# Patient Record
Sex: Male | Born: 1965 | Race: White | Hispanic: No | Marital: Married | Smoking: Never smoker
Health system: Southern US, Community
[De-identification: ages and names within clinical notes are randomized; demographics above are authoritative.]

## PROBLEM LIST (undated history)

## (undated) DIAGNOSIS — E119 Type 2 diabetes mellitus without complications: Secondary | ICD-10-CM

## (undated) DIAGNOSIS — M1990 Unspecified osteoarthritis, unspecified site: Secondary | ICD-10-CM

## (undated) DIAGNOSIS — I10 Essential (primary) hypertension: Secondary | ICD-10-CM

## (undated) DIAGNOSIS — G4730 Sleep apnea, unspecified: Secondary | ICD-10-CM

---

## 1991-04-17 HISTORY — PX: FRACTURE SURGERY: SHX138

## 1997-04-16 HISTORY — PX: KNEE ARTHROSCOPY: SUR90

## 2009-04-16 HISTORY — PX: BACK SURGERY: SHX140

## 2014-02-26 ENCOUNTER — Other Ambulatory Visit: Payer: Self-pay | Admitting: Physician Assistant

## 2014-02-26 NOTE — H&P (Signed)
TOTAL KNEE ADMISSION H&P  Patient is being admitted for right total knee arthroplasty.  Subjective:  Chief Complaint:right knee pain.  HPI: Lee [name], 48 y.o. male, has a history of pain and functional disability in the right knee due to arthritis and has failed non-surgical conservative treatments for greater than 12 weeks to includeNSAID's and/or analgesics, corticosteriod injections, viscosupplementation injections and activity modification.  Onset of symptoms was gradual, starting >10 years ago with rapidlly worsening course since that time. The patient noted prior procedures on the knee to include  arthroscopy and menisectomy on the right knee(s).  Patient currently rates pain in the right knee(s) at 8 out of 10 with activity. Patient has night pain, worsening of pain with activity and weight bearing and crepitus.  Patient has evidence of subchondral cysts, subchondral sclerosis, periarticular osteophytes and joint space narrowing by imaging studies. There is no active infection.  There are no active problems to display for this patient.  No past medical history on file.  No past surgical history on file.   (Not in a hospital admission) Allergies not on file  History  Substance Use Topics  . Smoking status: Not on file  . Smokeless tobacco: Not on file  . Alcohol Use: Not on file    No family history on file.   Review of Systems  Constitutional: Negative.   HENT: Negative.   Eyes: Negative.   Respiratory: Negative.   Cardiovascular: Negative.   Gastrointestinal: Negative.   Genitourinary: Negative.   Musculoskeletal: Positive for back pain and joint pain.  Skin: Negative.   Neurological: Negative.   Endo/Heme/Allergies: Negative.   Psychiatric/Behavioral: Negative.     Objective:  Physical Exam  Constitutional: He is oriented to person, place, and time. He appears well-developed and well-nourished.  HENT:  Head: Normocephalic and atraumatic.  Eyes: EOM are normal.  Pupils are equal, round, and reactive to light.  Neck: Normal range of motion. Neck supple.  Cardiovascular: Normal rate, regular rhythm and normal heart sounds.  Exam reveals no gallop and no friction rub.   No murmur heard. Respiratory: Effort normal and breath sounds normal. No respiratory distress. He has no wheezes. He has no rales.  GI: Soft. Bowel sounds are normal.  Musculoskeletal:  Exam of his right knee reveals varus thrust, range of motion 0-95 degrees. Mild to moderate patellofemoral crepitus. Tenderness to palpation medial joint line. Positive straight leg raise negative log roll. Stable to varus and valgus stress. He is neurovascularly intact distally.  Neurological: He is alert and oriented to person, place, and time.  Skin: Skin is warm and dry.  Psychiatric: He has a normal mood and affect. His behavior is normal. Judgment and thought content normal.    Vital signs in last 24 hours: @VSRANGES @  Labs:   There is no height or weight on file to calculate BMI.   Imaging Review Plain radiographs demonstrate severe degenerative joint disease of the right knee(s). The overall alignment ismild varus. The bone quality appears to be fair for age and reported activity level.  Assessment/Plan:  End stage arthritis, right knee   The patient history, physical examination, clinical judgment of the provider and imaging studies are consistent with end stage degenerative joint disease of the right knee(s) and total knee arthroplasty is deemed medically necessary. The treatment options including medical management, injection therapy arthroscopy and arthroplasty were discussed at length. The risks and benefits of total knee arthroplasty were presented and reviewed. The risks due to aseptic loosening, infection, stiffness,  patella tracking problems, thromboembolic complications and other imponderables were discussed. The patient acknowledged the explanation, agreed to proceed with the plan  and consent was signed. Patient is being admitted for inpatient treatment for surgery, pain control, PT, OT, prophylactic antibiotics, VTE prophylaxis, progressive ambulation and ADL's and discharge planning. The patient is planning to be discharged home with home health services   

## 2014-03-08 NOTE — Pre-Procedure Instructions (Addendum)
Lee [name]  03/08/2014   Your procedure is scheduled on:  Wednesday, December 2.  [facility] at 9:30 AM.  Call this number if you have problems the morning of surgery: [phone]   Remember:   Do not eat food or drink liquids after midnight Tuesday, December 1.   Take these medicines the morning of surgery with A SIP OF WATER: Amlodipine-Benazepril   Do not wear jewelry.  Do not wear lotions, powders, or colognes.    Men may shave face and neck.  Do not bring valuables to the hospital.              [facility] is not responsible  for any belongings or valuables.               Contacts, dentures or bridgework may not be worn into surgery.  Leave suitcase in the car. After surgery it may be brought to your room.  For patients admitted to the hospital, discharge time is determined by your treatment team.                 Special Instructions: [facility] - Preparing for Surgery  Before surgery, you can play an important role.  Because skin is not sterile, your skin needs to be as free of germs as possible.  You can reduce the number of germs on you skin by washing with CHG (chlorahexidine gluconate) soap before surgery.  CHG is an antiseptic cleaner which kills germs and bonds with the skin to continue killing germs even after washing.  Please DO NOT use if you have an allergy to CHG or antibacterial soaps.  If your skin becomes reddened/irritated stop using the CHG and inform your nurse when you arrive at Short Stay.  Do not shave (including legs and underarms) for at least 48 hours prior to the first CHG shower.  You may shave your face.  Please follow these instructions carefully:   1.  Shower with CHG Soap the night before surgery and the                                morning of Surgery.  2.  If you choose to wash your hair, wash your hair first as usual with your       normal shampoo.  3.  After you shampoo, rinse your hair and body thoroughly  to remove the                      Shampoo.  4.  Use CHG as you would any other liquid soap.  You can apply chg directly       to the skin and wash gently with scrungie or a clean washcloth.  5.  Apply the CHG Soap to your body ONLY FROM THE NECK DOWN.        Do not use on open wounds or open sores.  Avoid contact with your eyes,       ears, mouth and genitals (private parts).  Wash genitals (private parts)       with your normal soap.  6.  Wash thoroughly, paying special attention to the area where your surgery        will be performed.  7.  Thoroughly rinse your body with warm water from the neck down.  8.  DO NOT shower/wash with your normal soap after using and  rinsing off       the CHG Soap.  9.  Pat yourself dry with a clean towel.            10.  Wear clean pajamas.            11.  Place clean sheets on your bed the night of your first shower and do not        sleep with pets.  Day of Surgery  Do not apply any lotions/deoderants the morning of surgery.  Please wear clean clothes to the hospital/surgery center.      Please read over the following fact sheets that you were given: Pain Booklet, Coughing and Deep Breathing, Blood Transfusion Information and Surgical Site Infection Prevention

## 2014-03-09 ENCOUNTER — Encounter (HOSPITAL_COMMUNITY): Payer: Self-pay

## 2014-03-09 ENCOUNTER — Encounter (HOSPITAL_COMMUNITY)
Admission: RE | Admit: 2014-03-09 | Discharge: 2014-03-09 | Disposition: A | Discharge status: Home / Self Care | Payer: BC Managed Care – PPO | Source: Ambulatory Visit | Attending: Orthopedic Surgery | Admitting: Orthopedic Surgery

## 2014-03-09 DIAGNOSIS — M1711 Unilateral primary osteoarthritis, right knee: Secondary | ICD-10-CM | POA: Insufficient documentation

## 2014-03-09 DIAGNOSIS — Z01818 Encounter for other preprocedural examination: Secondary | ICD-10-CM | POA: Insufficient documentation

## 2014-03-09 HISTORY — DX: Essential (primary) hypertension: I10

## 2014-03-09 HISTORY — DX: Unspecified osteoarthritis, unspecified site: M19.90

## 2014-03-09 HISTORY — DX: Sleep apnea, unspecified: G47.30

## 2014-03-09 LAB — COMPREHENSIVE METABOLIC PANEL
ALK PHOS: 96 U/L (ref 39–117)
ALT: 37 U/L (ref 0–53)
AST: 20 U/L (ref 0–37)
Albumin: 4.1 g/dL (ref 3.5–5.2)
Anion gap: 15 (ref 5–15)
BUN: 9 mg/dL (ref 6–23)
CO2: 24 meq/L (ref 19–32)
Calcium: 9.2 mg/dL (ref 8.4–10.5)
Chloride: 103 mEq/L (ref 96–112)
Creatinine, Ser: 0.83 mg/dL (ref 0.50–1.35)
Glucose, Bld: 167 mg/dL — ABNORMAL HIGH (ref 70–99)
Potassium: 4.5 mEq/L (ref 3.7–5.3)
SODIUM: 142 meq/L (ref 137–147)
Total Bilirubin: 0.3 mg/dL (ref 0.3–1.2)
Total Protein: 7.6 g/dL (ref 6.0–8.3)

## 2014-03-09 LAB — PROTIME-INR
INR: 0.96 (ref 0.00–1.49)
Prothrombin Time: 12.8 seconds (ref 11.6–15.2)

## 2014-03-09 LAB — CBC WITH DIFFERENTIAL/PLATELET
BASOS ABS: 0 10*3/uL (ref 0.0–0.1)
Basophils Relative: 0 % (ref 0–1)
Eosinophils Absolute: 0.1 10*3/uL (ref 0.0–0.7)
Eosinophils Relative: 1 % (ref 0–5)
HCT: 49.5 % (ref 39.0–52.0)
Hemoglobin: 17.3 g/dL — ABNORMAL HIGH (ref 13.0–17.0)
LYMPHS ABS: 1.8 10*3/uL (ref 0.7–4.0)
Lymphocytes Relative: 24 % (ref 12–46)
MCH: 31.7 pg (ref 26.0–34.0)
MCHC: 34.9 g/dL (ref 30.0–36.0)
MCV: 90.7 fL (ref 78.0–100.0)
Monocytes Absolute: 0.6 10*3/uL (ref 0.1–1.0)
Monocytes Relative: 7 % (ref 3–12)
NEUTROS ABS: 5.2 10*3/uL (ref 1.7–7.7)
NEUTROS PCT: 68 % (ref 43–77)
PLATELETS: 210 10*3/uL (ref 150–400)
RBC: 5.46 MIL/uL (ref 4.22–5.81)
RDW: 12.9 % (ref 11.5–15.5)
WBC: 7.7 10*3/uL (ref 4.0–10.5)

## 2014-03-09 LAB — URINALYSIS, ROUTINE W REFLEX MICROSCOPIC
Bilirubin Urine: NEGATIVE
GLUCOSE, UA: NEGATIVE mg/dL
Hgb urine dipstick: NEGATIVE
Ketones, ur: 15 mg/dL — AB
LEUKOCYTES UA: NEGATIVE
Nitrite: NEGATIVE
Protein, ur: NEGATIVE mg/dL
Specific Gravity, Urine: 1.028 (ref 1.005–1.030)
Urobilinogen, UA: 0.2 mg/dL (ref 0.0–1.0)
pH: 5.5 (ref 5.0–8.0)

## 2014-03-09 LAB — TYPE AND SCREEN
ABO/RH(D): B NEG
Antibody Screen: NEGATIVE

## 2014-03-09 LAB — ABO/RH: ABO/RH(D): B NEG

## 2014-03-09 LAB — APTT: APTT: 24 s (ref 24–37)

## 2014-03-09 LAB — SURGICAL PCR SCREEN
MRSA, PCR: NEGATIVE
Staphylococcus aureus: NEGATIVE

## 2014-03-09 MED ORDER — CHLORHEXIDINE GLUCONATE 4 % EX LIQD: 60.0000 mL | Freq: Once | CUTANEOUS | Status: DC

## 2014-03-10 LAB — URINE CULTURE
Colony Count: NO GROWTH
Culture: NO GROWTH

## 2014-03-16 MED ORDER — CEFAZOLIN SODIUM-DEXTROSE 2-3 GM-% IV SOLR
2.0000 g | INTRAVENOUS | Status: AC
  Administered 2014-03-17: 2 g via INTRAVENOUS
  Filled 2014-03-16: qty 50

## 2014-03-17 ENCOUNTER — Inpatient Hospital Stay (HOSPITAL_COMMUNITY): Payer: BC Managed Care – PPO | Admitting: Anesthesiology

## 2014-03-17 ENCOUNTER — Inpatient Hospital Stay (HOSPITAL_COMMUNITY)
Admission: RE | Admit: 2014-03-17 | Discharge: 2014-03-19 | DRG: 470 | Disposition: A | Discharge status: Home Health | Payer: BC Managed Care – PPO | Source: Ambulatory Visit | Attending: Orthopedic Surgery | Admitting: Orthopedic Surgery

## 2014-03-17 ENCOUNTER — Encounter (HOSPITAL_COMMUNITY)
Admission: RE | Disposition: A | Discharge status: Home Health | Payer: Self-pay | Source: Ambulatory Visit | Attending: Orthopedic Surgery

## 2014-03-17 ENCOUNTER — Inpatient Hospital Stay (HOSPITAL_COMMUNITY): Payer: BC Managed Care – PPO

## 2014-03-17 ENCOUNTER — Encounter (HOSPITAL_COMMUNITY): Payer: Self-pay | Admitting: *Deleted

## 2014-03-17 DIAGNOSIS — Z79899 Other long term (current) drug therapy: Secondary | ICD-10-CM | POA: Diagnosis not present

## 2014-03-17 DIAGNOSIS — Z7901 Long term (current) use of anticoagulants: Secondary | ICD-10-CM

## 2014-03-17 DIAGNOSIS — M25561 Pain in right knee: Secondary | ICD-10-CM | POA: Diagnosis present

## 2014-03-17 DIAGNOSIS — M1711 Unilateral primary osteoarthritis, right knee: Principal | ICD-10-CM | POA: Diagnosis present

## 2014-03-17 DIAGNOSIS — M179 Osteoarthritis of knee, unspecified: Secondary | ICD-10-CM | POA: Diagnosis present

## 2014-03-17 DIAGNOSIS — G4730 Sleep apnea, unspecified: Secondary | ICD-10-CM | POA: Diagnosis present

## 2014-03-17 DIAGNOSIS — D62 Acute posthemorrhagic anemia: Secondary | ICD-10-CM | POA: Diagnosis not present

## 2014-03-17 DIAGNOSIS — Z96659 Presence of unspecified artificial knee joint: Secondary | ICD-10-CM

## 2014-03-17 DIAGNOSIS — I10 Essential (primary) hypertension: Secondary | ICD-10-CM | POA: Diagnosis present

## 2014-03-17 DIAGNOSIS — M1710 Unilateral primary osteoarthritis, unspecified knee: Secondary | ICD-10-CM | POA: Diagnosis present

## 2014-03-17 HISTORY — PX: TOTAL KNEE ARTHROPLASTY: SHX125

## 2014-03-17 LAB — GLUCOSE, CAPILLARY
Glucose-Capillary: 156 mg/dL — ABNORMAL HIGH (ref 70–99)
Glucose-Capillary: 166 mg/dL — ABNORMAL HIGH (ref 70–99)

## 2014-03-17 SURGERY — ARTHROPLASTY, KNEE, TOTAL
Anesthesia: General | Site: Knee | Laterality: Right

## 2014-03-17 MED ORDER — METHOCARBAMOL 1000 MG/10ML IJ SOLN
500.0000 mg | INTRAVENOUS | Status: AC
  Administered 2014-03-17: 500 mg via INTRAVENOUS
  Filled 2014-03-17: qty 5

## 2014-03-17 MED ORDER — MIDAZOLAM HCL 5 MG/5ML IJ SOLN
INTRAMUSCULAR | Status: DC | PRN
  Administered 2014-03-17: 2 mg via INTRAVENOUS

## 2014-03-17 MED ORDER — METOCLOPRAMIDE HCL 5 MG/ML IJ SOLN: 5.0000 mg | Freq: Three times a day (TID) | INTRAMUSCULAR | Status: DC | PRN

## 2014-03-17 MED ORDER — APIXABAN 2.5 MG PO TABS: ORAL_TABLET | ORAL | Status: DC

## 2014-03-17 MED ORDER — HYDROMORPHONE HCL 1 MG/ML IJ SOLN
0.5000 mg | INTRAMUSCULAR | Status: DC | PRN
  Administered 2014-03-17 – 2014-03-18 (×6): 1 mg via INTRAVENOUS
  Filled 2014-03-17 (×6): qty 1

## 2014-03-17 MED ORDER — BISACODYL 5 MG PO TBEC: 5.0000 mg | DELAYED_RELEASE_TABLET | Freq: Every day | ORAL | Status: AC | PRN

## 2014-03-17 MED ORDER — MENTHOL 3 MG MT LOZG: 1.0000 | LOZENGE | OROMUCOSAL | Status: DC | PRN

## 2014-03-17 MED ORDER — METHOCARBAMOL 1000 MG/10ML IJ SOLN
500.0000 mg | Freq: Four times a day (QID) | INTRAVENOUS | Status: DC | PRN
  Filled 2014-03-17: qty 5

## 2014-03-17 MED ORDER — METOCLOPRAMIDE HCL 10 MG PO TABS: 5.0000 mg | ORAL_TABLET | Freq: Three times a day (TID) | ORAL | Status: DC | PRN

## 2014-03-17 MED ORDER — FENTANYL CITRATE 0.05 MG/ML IJ SOLN
INTRAMUSCULAR | Status: AC
  Filled 2014-03-17: qty 5

## 2014-03-17 MED ORDER — ONDANSETRON HCL 4 MG/2ML IJ SOLN
4.0000 mg | Freq: Once | INTRAMUSCULAR | Status: AC | PRN
  Administered 2014-03-17: 4 mg via INTRAVENOUS

## 2014-03-17 MED ORDER — NEOSTIGMINE METHYLSULFATE 10 MG/10ML IV SOLN
INTRAVENOUS | Status: DC | PRN
  Administered 2014-03-17: 4 mg via INTRAVENOUS

## 2014-03-17 MED ORDER — LIDOCAINE HCL (CARDIAC) 20 MG/ML IV SOLN
INTRAVENOUS | Status: DC | PRN
  Administered 2014-03-17: 50 mg via INTRAVENOUS

## 2014-03-17 MED ORDER — METHOCARBAMOL 500 MG PO TABS
500.0000 mg | ORAL_TABLET | Freq: Four times a day (QID) | ORAL | Status: DC | PRN
  Administered 2014-03-18 (×2): 500 mg via ORAL
  Filled 2014-03-17 (×2): qty 1

## 2014-03-17 MED ORDER — ONDANSETRON HCL 4 MG/2ML IJ SOLN
INTRAMUSCULAR | Status: AC
  Filled 2014-03-17: qty 2

## 2014-03-17 MED ORDER — PROPOFOL 10 MG/ML IV BOLUS
INTRAVENOUS | Status: DC | PRN
  Administered 2014-03-17: 200 mg via INTRAVENOUS

## 2014-03-17 MED ORDER — BUPIVACAINE LIPOSOME 1.3 % IJ SUSP
INTRAMUSCULAR | Status: DC | PRN
  Administered 2014-03-17: 20 mL

## 2014-03-17 MED ORDER — AMLODIPINE BESYLATE 5 MG PO TABS
5.0000 mg | ORAL_TABLET | Freq: Every day | ORAL | Status: DC
  Administered 2014-03-18 – 2014-03-19 (×2): 5 mg via ORAL
  Filled 2014-03-17 (×2): qty 1

## 2014-03-17 MED ORDER — LACTATED RINGERS IV SOLN
INTRAVENOUS | Status: DC | PRN
  Administered 2014-03-17 (×2): via INTRAVENOUS

## 2014-03-17 MED ORDER — METHOCARBAMOL 500 MG PO TABS: 500.0000 mg | ORAL_TABLET | Freq: Four times a day (QID) | ORAL | Status: AC

## 2014-03-17 MED ORDER — ONDANSETRON HCL 4 MG PO TABS: 4.0000 mg | ORAL_TABLET | Freq: Four times a day (QID) | ORAL | Status: DC | PRN

## 2014-03-17 MED ORDER — POTASSIUM CHLORIDE IN NACL 20-0.9 MEQ/L-% IV SOLN
INTRAVENOUS | Status: DC
  Administered 2014-03-17 – 2014-03-18 (×3): via INTRAVENOUS
  Filled 2014-03-17 (×3): qty 1000

## 2014-03-17 MED ORDER — BUPIVACAINE HCL (PF) 0.5 % IJ SOLN
INTRAMUSCULAR | Status: AC
  Filled 2014-03-17: qty 10

## 2014-03-17 MED ORDER — CEFAZOLIN SODIUM-DEXTROSE 2-3 GM-% IV SOLR
2.0000 g | Freq: Four times a day (QID) | INTRAVENOUS | Status: AC
  Administered 2014-03-17 (×2): 2 g via INTRAVENOUS
  Filled 2014-03-17 (×2): qty 50

## 2014-03-17 MED ORDER — BISACODYL 5 MG PO TBEC: 5.0000 mg | DELAYED_RELEASE_TABLET | Freq: Every day | ORAL | Status: DC | PRN

## 2014-03-17 MED ORDER — SODIUM CHLORIDE 0.9 % IR SOLN
Status: DC | PRN
  Administered 2014-03-17: 3000 mL

## 2014-03-17 MED ORDER — FENTANYL CITRATE 0.05 MG/ML IJ SOLN
INTRAMUSCULAR | Status: DC | PRN
  Administered 2014-03-17: 100 ug via INTRAVENOUS
  Administered 2014-03-17: 150 ug via INTRAVENOUS

## 2014-03-17 MED ORDER — BUPIVACAINE HCL 0.5 % IJ SOLN
INTRAMUSCULAR | Status: DC | PRN
  Administered 2014-03-17: 10 mL

## 2014-03-17 MED ORDER — OXYCODONE HCL 5 MG PO TABS
ORAL_TABLET | ORAL | Status: AC
  Filled 2014-03-17: qty 1

## 2014-03-17 MED ORDER — LACTATED RINGERS IV SOLN: INTRAVENOUS | Status: DC

## 2014-03-17 MED ORDER — BUPIVACAINE LIPOSOME 1.3 % IJ SUSP
20.0000 mL | INTRAMUSCULAR | Status: DC
  Filled 2014-03-17: qty 20

## 2014-03-17 MED ORDER — GLYCOPYRROLATE 0.2 MG/ML IJ SOLN
INTRAMUSCULAR | Status: DC | PRN
  Administered 2014-03-17: .7 mg via INTRAVENOUS

## 2014-03-17 MED ORDER — ONDANSETRON HCL 4 MG/2ML IJ SOLN: 4.0000 mg | Freq: Four times a day (QID) | INTRAMUSCULAR | Status: DC | PRN

## 2014-03-17 MED ORDER — HYDROMORPHONE HCL 1 MG/ML IJ SOLN
INTRAMUSCULAR | Status: AC
  Administered 2014-03-17: 0.5 mg via INTRAVENOUS
  Filled 2014-03-17: qty 2

## 2014-03-17 MED ORDER — BENAZEPRIL HCL 40 MG PO TABS
40.0000 mg | ORAL_TABLET | Freq: Every day | ORAL | Status: DC
  Administered 2014-03-18 – 2014-03-19 (×2): 40 mg via ORAL
  Filled 2014-03-17 (×2): qty 1

## 2014-03-17 MED ORDER — OXYCODONE HCL 5 MG PO TABS
5.0000 mg | ORAL_TABLET | ORAL | Status: DC | PRN
  Administered 2014-03-17 – 2014-03-19 (×11): 10 mg via ORAL
  Filled 2014-03-17 (×10): qty 2

## 2014-03-17 MED ORDER — LACTATED RINGERS IV SOLN
INTRAVENOUS | Status: DC
  Administered 2014-03-17: 10:00:00 via INTRAVENOUS

## 2014-03-17 MED ORDER — ROCURONIUM BROMIDE 100 MG/10ML IV SOLN
INTRAVENOUS | Status: DC | PRN
  Administered 2014-03-17: 10 mg via INTRAVENOUS
  Administered 2014-03-17: 40 mg via INTRAVENOUS

## 2014-03-17 MED ORDER — ACETAMINOPHEN 650 MG RE SUPP: 650.0000 mg | Freq: Four times a day (QID) | RECTAL | Status: DC | PRN

## 2014-03-17 MED ORDER — DIPHENHYDRAMINE HCL 12.5 MG/5ML PO ELIX
12.5000 mg | ORAL_SOLUTION | ORAL | Status: DC | PRN
  Administered 2014-03-18 (×2): 25 mg via ORAL
  Filled 2014-03-17 (×3): qty 10

## 2014-03-17 MED ORDER — HYDROMORPHONE HCL 1 MG/ML IJ SOLN
0.2500 mg | INTRAMUSCULAR | Status: DC | PRN
  Administered 2014-03-17: 0.5 mg via INTRAVENOUS
  Administered 2014-03-17: 0.25 mg via INTRAVENOUS
  Administered 2014-03-17 (×2): 0.5 mg via INTRAVENOUS
  Administered 2014-03-17: 0.25 mg via INTRAVENOUS

## 2014-03-17 MED ORDER — PROPOFOL 10 MG/ML IV BOLUS
INTRAVENOUS | Status: AC
  Filled 2014-03-17: qty 20

## 2014-03-17 MED ORDER — CELECOXIB 200 MG PO CAPS
200.0000 mg | ORAL_CAPSULE | Freq: Two times a day (BID) | ORAL | Status: DC
  Administered 2014-03-17 – 2014-03-19 (×4): 200 mg via ORAL
  Filled 2014-03-17 (×5): qty 1

## 2014-03-17 MED ORDER — ALUM & MAG HYDROXIDE-SIMETH 200-200-20 MG/5ML PO SUSP: 30.0000 mL | ORAL | Status: DC | PRN

## 2014-03-17 MED ORDER — ZOLPIDEM TARTRATE 5 MG PO TABS: 10.0000 mg | ORAL_TABLET | Freq: Every evening | ORAL | Status: DC | PRN

## 2014-03-17 MED ORDER — AMLODIPINE BESY-BENAZEPRIL HCL 5-40 MG PO CAPS: 1.0000 | ORAL_CAPSULE | Freq: Every day | ORAL | Status: DC

## 2014-03-17 MED ORDER — ACETAMINOPHEN 325 MG PO TABS: 650.0000 mg | ORAL_TABLET | Freq: Four times a day (QID) | ORAL | Status: DC | PRN

## 2014-03-17 MED ORDER — OXYCODONE-ACETAMINOPHEN 5-325 MG PO TABS: 1.0000 | ORAL_TABLET | ORAL | Status: DC | PRN

## 2014-03-17 MED ORDER — ONDANSETRON HCL 4 MG PO TABS: 4.0000 mg | ORAL_TABLET | Freq: Three times a day (TID) | ORAL | Status: DC | PRN

## 2014-03-17 MED ORDER — PHENOL 1.4 % MT LIQD: 1.0000 | OROMUCOSAL | Status: DC | PRN

## 2014-03-17 MED ORDER — SODIUM CHLORIDE 0.9 % IJ SOLN
INTRAMUSCULAR | Status: DC | PRN
  Administered 2014-03-17: 40 mL

## 2014-03-17 MED ORDER — ONDANSETRON HCL 4 MG/2ML IJ SOLN
INTRAMUSCULAR | Status: DC | PRN
  Administered 2014-03-17: 4 mg via INTRAVENOUS

## 2014-03-17 MED ORDER — 0.9 % SODIUM CHLORIDE (POUR BTL) OPTIME
TOPICAL | Status: DC | PRN
  Administered 2014-03-17: 1000 mL

## 2014-03-17 MED ORDER — CHLORHEXIDINE GLUCONATE 4 % EX LIQD
60.0000 mL | Freq: Once | CUTANEOUS | Status: DC
  Filled 2014-03-17: qty 60

## 2014-03-17 MED ORDER — DOCUSATE SODIUM 100 MG PO CAPS
100.0000 mg | ORAL_CAPSULE | Freq: Two times a day (BID) | ORAL | Status: DC
  Administered 2014-03-17 – 2014-03-19 (×4): 100 mg via ORAL
  Filled 2014-03-17 (×5): qty 1

## 2014-03-17 SURGICAL SUPPLY — 64 items
BANDAGE ELASTIC 4 VELCRO ST LF (GAUZE/BANDAGES/DRESSINGS) ×3 IMPLANT
BANDAGE ELASTIC 6 VELCRO ST LF (GAUZE/BANDAGES/DRESSINGS) ×3 IMPLANT
BANDAGE ESMARK 6X9 LF (GAUZE/BANDAGES/DRESSINGS) ×1 IMPLANT
BENZOIN TINCTURE PRP APPL 2/3 (GAUZE/BANDAGES/DRESSINGS) ×3 IMPLANT
BLADE SAG 18X100X1.27 (BLADE) ×6 IMPLANT
BNDG ESMARK 6X9 LF (GAUZE/BANDAGES/DRESSINGS) ×3
BOWL SMART MIX CTS (DISPOSABLE) ×3 IMPLANT
CAPT KNEE TOTAL 3 ×3 IMPLANT
CEMENT BONE SIMPLEX SPEEDSET (Cement) ×6 IMPLANT
CLOSURE WOUND 1/2 X4 (GAUZE/BANDAGES/DRESSINGS) ×2
COVER SURGICAL LIGHT HANDLE (MISCELLANEOUS) ×3 IMPLANT
CUFF TOURNIQUET SINGLE 34IN LL (TOURNIQUET CUFF) ×3 IMPLANT
DRAPE EXTREMITY T 121X128X90 (DRAPE) ×3 IMPLANT
DRAPE IMP U-DRAPE 54X76 (DRAPES) ×3 IMPLANT
DRAPE PROXIMA HALF (DRAPES) ×3 IMPLANT
DRAPE U-SHAPE 47X51 STRL (DRAPES) ×3 IMPLANT
DRSG PAD ABDOMINAL 8X10 ST (GAUZE/BANDAGES/DRESSINGS) ×3 IMPLANT
DURAPREP 26ML APPLICATOR (WOUND CARE) ×3 IMPLANT
ELECT CAUTERY BLADE 6.4 (BLADE) ×3 IMPLANT
ELECT REM PT RETURN 9FT ADLT (ELECTROSURGICAL) ×3
ELECTRODE REM PT RTRN 9FT ADLT (ELECTROSURGICAL) ×1 IMPLANT
EVACUATOR 1/8 PVC DRAIN (DRAIN) ×3 IMPLANT
FACESHIELD WRAPAROUND (MASK) ×9 IMPLANT
GAUZE SPONGE 4X4 12PLY STRL (GAUZE/BANDAGES/DRESSINGS) ×3 IMPLANT
GAUZE XEROFORM 1X8 LF (GAUZE/BANDAGES/DRESSINGS) ×3 IMPLANT
GLOVE BIOGEL PI IND STRL 7.0 (GLOVE) ×2 IMPLANT
GLOVE BIOGEL PI INDICATOR 7.0 (GLOVE) ×4
GLOVE ECLIPSE 6.5 STRL STRAW (GLOVE) ×6 IMPLANT
GLOVE ORTHO TXT STRL SZ7.5 (GLOVE) ×3 IMPLANT
GLOVE SURG SS PI 8.0 STRL IVOR (GLOVE) ×6 IMPLANT
GOWN STRL REUS W/ TWL LRG LVL3 (GOWN DISPOSABLE) ×3 IMPLANT
GOWN STRL REUS W/ TWL XL LVL3 (GOWN DISPOSABLE) ×2 IMPLANT
GOWN STRL REUS W/TWL LRG LVL3 (GOWN DISPOSABLE) ×6
GOWN STRL REUS W/TWL XL LVL3 (GOWN DISPOSABLE) ×4
HANDPIECE INTERPULSE COAX TIP (DISPOSABLE) ×2
IMMOBILIZER KNEE 22 UNIV (SOFTGOODS) ×3 IMPLANT
IMMOBILIZER KNEE 24 THIGH 36 (MISCELLANEOUS) IMPLANT
IMMOBILIZER KNEE 24 UNIV (MISCELLANEOUS)
KIT BASIN OR (CUSTOM PROCEDURE TRAY) ×3 IMPLANT
KIT ROOM TURNOVER OR (KITS) ×3 IMPLANT
MANIFOLD NEPTUNE II (INSTRUMENTS) ×3 IMPLANT
NEEDLE 18GX1X1/2 (RX/OR ONLY) (NEEDLE) ×3 IMPLANT
NEEDLE HYPO 25GX1X1/2 BEV (NEEDLE) ×3 IMPLANT
NS IRRIG 1000ML POUR BTL (IV SOLUTION) ×3 IMPLANT
PACK TOTAL JOINT (CUSTOM PROCEDURE TRAY) ×3 IMPLANT
PACK UNIVERSAL I (CUSTOM PROCEDURE TRAY) ×3 IMPLANT
PAD ARMBOARD 7.5X6 YLW CONV (MISCELLANEOUS) ×6 IMPLANT
PAD CAST 4YDX4 CTTN HI CHSV (CAST SUPPLIES) ×1 IMPLANT
PADDING CAST COTTON 4X4 STRL (CAST SUPPLIES) ×2
PADDING CAST COTTON 6X4 STRL (CAST SUPPLIES) ×3 IMPLANT
SET HNDPC FAN SPRY TIP SCT (DISPOSABLE) ×1 IMPLANT
STRIP CLOSURE SKIN 1/2X4 (GAUZE/BANDAGES/DRESSINGS) ×4 IMPLANT
SUCTION FRAZIER TIP 10 FR DISP (SUCTIONS) ×3 IMPLANT
SUT MNCRL AB 4-0 PS2 18 (SUTURE) ×3 IMPLANT
SUT VIC AB 0 CT1 27 (SUTURE) ×2
SUT VIC AB 0 CT1 27XBRD ANBCTR (SUTURE) ×1 IMPLANT
SUT VIC AB 1 CT1 27 (SUTURE) ×4
SUT VIC AB 1 CT1 27XBRD ANBCTR (SUTURE) ×2 IMPLANT
SUT VIC AB 2-0 CT1 27 (SUTURE) ×6
SUT VIC AB 2-0 CT1 TAPERPNT 27 (SUTURE) ×3 IMPLANT
SYR 50ML LL SCALE MARK (SYRINGE) ×3 IMPLANT
SYR CONTROL 10ML LL (SYRINGE) ×3 IMPLANT
TOWEL OR 17X24 6PK STRL BLUE (TOWEL DISPOSABLE) ×3 IMPLANT
TOWEL OR 17X26 10 PK STRL BLUE (TOWEL DISPOSABLE) ×3 IMPLANT

## 2014-03-17 NOTE — Anesthesia Procedure Notes (Signed)
Procedure Name: Intubation Date/Time: 03/17/2014 11:47 AM Performed by: Gwenyth AllegraADAMI, Lee [name] Pre-anesthesia Checklist: Emergency Drugs available, Timeout performed, Patient identified, Suction available and Patient being monitored Patient Re-evaluated:Patient Re-evaluated prior to inductionOxygen Delivery Method: Circle system utilized Preoxygenation: Pre-oxygenation with 100% oxygen Intubation Type: IV induction Ventilation: Mask ventilation without difficulty and Oral airway inserted - appropriate to patient size Laryngoscope Size: Mac and 4 Grade View: Grade III Tube type: Oral Tube size: 8.0 mm Number of attempts: 1 Airway Equipment and Method: Bougie stylet Placement Confirmation: ETT inserted through vocal cords under direct vision,  breath sounds checked- equal and bilateral and positive ETCO2 Secured at: 22 cm Tube secured with: Tape Dental Injury: Teeth and Oropharynx as per pre-operative assessment

## 2014-03-17 NOTE — Anesthesia Preprocedure Evaluation (Signed)
Anesthesia Evaluation  Patient identified by MRN, date of birth, ID band Patient awake    Reviewed: Allergy & Precautions, H&P , NPO status , Patient's Chart, lab work & pertinent test results  Airway        Dental   Pulmonary sleep apnea ,          Cardiovascular hypertension,     Neuro/Psych    GI/Hepatic (+) Hepatitis -  Endo/Other    Renal/GU      Musculoskeletal  (+) Arthritis -,   Abdominal   Peds  Hematology   Anesthesia Other Findings   Reproductive/Obstetrics                             Anesthesia Physical Anesthesia Plan  ASA: II  Anesthesia Plan: General   Post-op Pain Management:    Induction: Intravenous  Airway Management Planned: Oral ETT and LMA  Additional Equipment:   Intra-op Plan:   Post-operative Plan: Extubation in OR  Informed Consent: I have reviewed the patients History and Physical, chart, labs and discussed the procedure including the risks, benefits and alternatives for the proposed anesthesia with the patient or authorized representative who has indicated his/her understanding and acceptance.     Plan Discussed with: CRNA, Anesthesiologist and Surgeon  Anesthesia Plan Comments:         Anesthesia Quick Evaluation

## 2014-03-17 NOTE — Discharge Summary (Addendum)
Patient ID: Lee [name] MRN: [id number] DOB/AGE: 01-01-1966 48 y.o.  Admit date: 03/17/2014 Discharge date: 03/19/2014  Admission Diagnoses:  Active Problems:   DJD (degenerative joint disease) of knee   Discharge Diagnoses:  Same  Past Medical History  Diagnosis Date  . Hypertension   . Sleep apnea     sleep study done , uses CPAP not all the time  . Arthritis   . Hepatitis     had preventive shot because of eating at a diner years ago    Surgeries: Procedure(s): TOTAL KNEE ARTHROPLASTY on 03/17/2014   Consultants:    Discharged Condition: Improved  Hospital Course: Lee MourningDanny Huard is an 48 y.o. male who was admitted 03/17/2014 for operative treatment of primary osteoarthritis right knee. Patient has severe unremitting pain that affects sleep, daily activities, and work/hobbies. After pre-op clearance the patient was taken to the operating room on 03/17/2014 and underwent  Procedure(s): TOTAL KNEE ARTHROPLASTY.  Patient developed ABLA on POD #2 with a Hb of 11.6.  He is asymptomatic but we will continue to follow.  Patient was given perioperative antibiotics:      Anti-infectives    Start     Dose/Rate Route Frequency Ordered Stop   03/17/14 1730  ceFAZolin (ANCEF) IVPB 2 g/50 mL premix     2 g100 mL/hr over 30 Minutes Intravenous Every 6 hours 03/17/14 1606 03/17/14 2314   03/17/14 0600  ceFAZolin (ANCEF) IVPB 2 g/50 mL premix     2 g100 mL/hr over 30 Minutes Intravenous On call to O.R. 03/16/14 1427 03/17/14 1130       Patient was given sequential compression devices, early ambulation, and chemoprophylaxis to prevent DVT.  Patient benefited maximally from hospital stay and there were no complications.    Recent vital signs:  Patient Vitals for the past 24 hrs:  BP Temp Temp src Pulse Resp SpO2  03/19/14 0616 119/76 mmHg 98.5 F (36.9 C) - 84 18 98 %  03/19/14 0400 - - - - 16 -  03/19/14 0000 - - - - 16 -  03/18/14 2100 129/75 mmHg 98.6 F (37 C) Oral 93 18 99 %   03/18/14 2000 - - - - 16 -  03/18/14 1300 (!) 126/57 mmHg 100 F (37.8 C) - (!) 103 16 99 %     Recent laboratory studies:   Recent Labs  03/18/14 0315 03/19/14 0518  WBC 9.6 8.8  HGB 13.7 11.9*  HCT 42.1 35.7*  PLT 145* 158  NA 137 137  K 4.1 4.1  CL 99 100  CO2 25 25  BUN 8 8  CREATININE 0.86 0.77  GLUCOSE 157* 163*  CALCIUM 8.5 8.5     Discharge Medications:     Medication List    TAKE these medications        amLODipine-benazepril 5-40 MG per capsule  Commonly known as:  LOTREL  Take 1 capsule by mouth daily.     apixaban 2.5 MG Tabs tablet  Commonly known as:  ELIQUIS  Take one tablet twice daily for 12 days following surgery to prevent blood clots     bisacodyl 5 MG EC tablet  Commonly known as:  DULCOLAX  Take 1 tablet (5 mg total) by mouth daily as needed for moderate constipation.     methocarbamol 500 MG tablet  Commonly known as:  ROBAXIN  Take 1 tablet (500 mg total) by mouth 4 (four) times daily.     ondansetron 4 MG tablet  Commonly known as:  ZOFRAN  Take 1 tablet (4 mg total) by mouth every 8 (eight) hours as needed for nausea or vomiting.     oxyCODONE-acetaminophen 5-325 MG per tablet  Commonly known as:  ROXICET  Take 1-2 tablets by mouth every 4 (four) hours as needed.     zolpidem 10 MG tablet  Commonly known as:  AMBIEN  Take 1 tablet by mouth at bedtime as needed.        Diagnostic Studies: Dg Knee Right Port  03/17/2014   CLINICAL DATA:  Postoperative radiographs following RIGHT total knee arthroplasty.  EXAM: PORTABLE RIGHT KNEE - 1-2 VIEW  COMPARISON:  None.  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 Expected postsurgical changes in the soft tissues with surgical drain present.  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   Electronically Signed   By: Andreas NewportGeoffrey  Lamke M.D.   On: 03/17/2014 14:28    Disposition: Final discharge disposition not confirmed  Discharge Instructions     CPM    Complete by:  As directed   Continuous passive motion machine (CPM):      Use the CPM from 0- to 60 for 6 hours per day.      You may increase by 10 per day.  You may break it up into 2 or 3 sessions per day.      Use CPM for 2-3 weeks or until you are told to stop.     Call MD / Call 911    Complete by:  As directed   If you experience chest pain or shortness of breath, CALL 911 and be transported to the hospital emergency room.  If you develope a fever above 101 F, pus (white drainage) or increased drainage or redness at the wound, or calf pain, call your surgeon's office.     Change dressing    Complete by:  As directed   Change dressing on Saturday, then change the dressing daily with sterile 4 x 4 inch gauze dressing and apply TED hose.  You may clean the incision with alcohol prior to redressing.     Constipation Prevention    Complete by:  As directed   Drink plenty of fluids.  Prune juice may be helpful.  You may use a stool softener, such as Colace (over the counter) 100 mg twice a day.  Use MiraLax (over the counter) for constipation as needed.     Diet - low sodium heart healthy    Complete by:  As directed      Discharge instructions    Complete by:  As directed   Weight bearing as tolerated.  Take Eliquis as directed for a total of 12 days following surgery to prevent blood clots.  Change dressing daily starting on Saturday.  May shower on Monday, but do not soak incision.  May apply ice for up to 20 minutes at a time for pain and swelling.  Follow up appointment in two weeks.     Do not put a pillow under the knee. Place it under the heel.    Complete by:  As directed   Place gray foam under operative heel when in bed or in a chair to work on extension     Increase activity slowly as tolerated    Complete by:  As directed      TED hose    Complete by:  As directed   Use stockings (TED hose) for 2 weeks on both leg(s).  You  may remove them at night for sleeping.            Follow-up Information    Follow up with [facility] F, MD. Schedule an appointment as soon as possible for a visit in 2 weeks.   Specialty:  Orthopedic Surgery   Contact information:   [address] [place] [address] [phone]        Signed: Gearldine Shown 03/19/2014, 7:56 AM

## 2014-03-17 NOTE — Plan of Care (Signed)
Problem: Phase I Progression Outcomes Goal: Dangle or out of bed evening of surgery Outcome: Completed/Met Date Met:  03/17/14

## 2014-03-17 NOTE — Op Note (Signed)
Lee Amos:  [name], [name]                  ACCOUNT NO.:  [id number]  MEDICAL RECORD NO.:  [id number]  LOCATION:  5N10C                        FACILITY:  [facility]  PHYSICIAN:  Loreta Aveaniel F. Massey Ruhland, M.D. DATE OF BIRTH:  11-20-1965  DATE OF PROCEDURE:  03/17/2014 DATE OF DISCHARGE:                              OPERATIVE REPORT   PREOPERATIVE DIAGNOSIS:  Right knee primary localized end-stage degenerative arthritis, varus alignment.  POSTOPERATIVE DIAGNOSIS:  Right knee primary localized end-stage degenerative arthritis, varus alignment.  PROCEDURE:  Right knee modified minimally invasive total knee replacement with Stryker Triathlon prosthesis.  Soft tissue balancing. Cemented pegged posterior stabilized #5 femoral component.  Cemented #5 tibial component, 11-mm PS insert.  Cemented resurfacing 35-mm patellar component.  SURGEON:  Loreta Aveaniel F. Cleotis Sparr, MD  ASSISTANT:  Odelia GageLindsey Anton, PA present throughout the entire case and necessary for timely completion of procedure.  ANESTHESIA:  General.  BLOOD LOSS:  Minimal.  SPECIMENS:  None.  CULTURES:  None.  COMPLICATIONS:  None.  DRESSINGS:  Soft compressive knee immobilizer.  TOURNIQUET:  One hour.  DRAINS:  Hemovac x1.  DESCRIPTION OF PROCEDURE:  The patient was brought to the operating room, placed on the operating room table in supine position.  After adequate general anesthesia had been obtained, tourniquet applied, prepped and draped in usual sterile fashion.  Exsanguinated with elevation of Esmarch, tourniquet inflated to 350 mmHg.  Anterior incision above the patella down to tibial tubercle.  Medial arthrotomy, vastus splitting.  Medial capsule release.  An 8-mm resection distal femur 5 degrees of valgus.  Using epicondylar axis, the femur was sized, cut, and fitted for a posterior stabilized pegged #5 component. Proximal tibial resection extramedullary guide.  A 3-degree posterior slope cut.  Size #5 component as well.  Patella  exposed.  Posterior 10 mm removed.  Drilled, sized, and fitted for a 35-mm component.  Trials put in place.  #5 components above and below and 11-mm PS insert.  With this construct, I was very pleased with balancing, mechanical axis, stability, flexion and extension, and patellar tracking.  Tibia was marked for rotation and hand reamed.  All trials removed.  Copious irrigation with pulse irrigating device.  Cement prepared, placed on all components, firmly seated.  Polyethylene attached to tibia, knee reduced.  Patella held with a clamp.  Once the cement hardened, knee was irrigated again.  Soft tissues injected with Exparel.  Arthrotomy closed with #1 Vicryl, skin and subcutaneous tissue with Vicryl.  Injected Marcaine.  Hemovac had been placed.  Sterile compressive dressing applied.  Tourniquet deflated and removed.  Knee immobilizer applied. Anesthesia reversed.  Brought to the recovery room.  Tolerated the surgery well.  No complications.     Loreta Aveaniel F. Mystie Ormand, M.D.     DFM/MEDQ  D:  03/17/2014  T:  03/17/2014  Job:  [id number]

## 2014-03-17 NOTE — Progress Notes (Signed)
Orthopedic Tech Progress Note Patient Details:  Lee [name] 10-11-1965 [id number]  CPM Right Knee CPM Right Knee: On Right Knee Flexion (Degrees): 60 Right Knee Extension (Degrees): 0 Additional Comments: applied ohf to bed- footsie roll at bedside   Jennye MoccasinHughes, Lee [name] 03/17/2014, 6:46 PM

## 2014-03-17 NOTE — Progress Notes (Signed)
Utilization review completed.  

## 2014-03-17 NOTE — Anesthesia Postprocedure Evaluation (Signed)
  Anesthesia Post-op Note  Patient: Cyril MourningDanny Imm  Procedure(s) Performed: Procedure(s): TOTAL KNEE ARTHROPLASTY (Right)  Patient Location: PACU  Anesthesia Type:General  Level of Consciousness: awake, alert , oriented and patient cooperative  Airway and Oxygen Therapy: Patient Spontanous Breathing  Post-op Pain: mild  Post-op Assessment: Post-op Vital signs reviewed, Patient's Cardiovascular Status Stable, Respiratory Function Stable, Patent Airway, No signs of Nausea or vomiting and Pain level controlled  Post-op Vital Signs: stable  Last Vitals:  Filed Vitals:   03/17/14 1415  BP: 135/86  Pulse: 64  Temp:   Resp: 16    Complications: No apparent anesthesia complications

## 2014-03-17 NOTE — Interval H&P Note (Signed)
History and Physical Interval Note:  03/17/2014 8:28 AM  Lee [name]  has presented today for surgery, with the diagnosis of DJD RIGHT KNEE  The various methods of treatment have been discussed with the patient and family. After consideration of risks, benefits and other options for treatment, the patient has consented to  Procedure(s): TOTAL KNEE ARTHROPLASTY (Right) as a surgical intervention .  The patient's history has been reviewed, patient examined, no change in status, stable for surgery.  I have reviewed the patient's chart and labs.  Questions were answered to the patient's satisfaction.     MURPHY,DANIEL F

## 2014-03-17 NOTE — Transfer of Care (Signed)
Immediate Anesthesia Transfer of Care Note  Patient: Lee MourningDanny Derossett  Procedure(s) Performed: Procedure(s): TOTAL KNEE ARTHROPLASTY (Right)  Patient Location: PACU  Anesthesia Type:General  Level of Consciousness: awake and alert   Airway & Oxygen Therapy: Patient Spontanous Breathing and Patient connected to nasal cannula oxygen  Post-op Assessment: Report given to PACU RN and Post -op Vital signs reviewed and stable  Post vital signs: Reviewed and stable  Complications: No apparent anesthesia complications

## 2014-03-17 NOTE — H&P (View-Only) (Signed)
TOTAL KNEE ADMISSION H&P  Patient is being admitted for right total knee arthroplasty.  Subjective:  Chief Complaint:right knee pain.  HPI: Lee [name], 48 y.o. male, has a history of pain and functional disability in the right knee due to arthritis and has failed non-surgical conservative treatments for greater than 12 weeks to includeNSAID's and/or analgesics, corticosteriod injections, viscosupplementation injections and activity modification.  Onset of symptoms was gradual, starting >10 years ago with rapidlly worsening course since that time. The patient noted prior procedures on the knee to include  arthroscopy and menisectomy on the right knee(s).  Patient currently rates pain in the right knee(s) at 8 out of 10 with activity. Patient has night pain, worsening of pain with activity and weight bearing and crepitus.  Patient has evidence of subchondral cysts, subchondral sclerosis, periarticular osteophytes and joint space narrowing by imaging studies. There is no active infection.  There are no active problems to display for this patient.  No past medical history on file.  No past surgical history on file.   (Not in a hospital admission) Allergies not on file  History  Substance Use Topics  . Smoking status: Not on file  . Smokeless tobacco: Not on file  . Alcohol Use: Not on file    No family history on file.   Review of Systems  Constitutional: Negative.   HENT: Negative.   Eyes: Negative.   Respiratory: Negative.   Cardiovascular: Negative.   Gastrointestinal: Negative.   Genitourinary: Negative.   Musculoskeletal: Positive for back pain and joint pain.  Skin: Negative.   Neurological: Negative.   Endo/Heme/Allergies: Negative.   Psychiatric/Behavioral: Negative.     Objective:  Physical Exam  Constitutional: He is oriented to person, place, and time. He appears well-developed and well-nourished.  HENT:  Head: Normocephalic and atraumatic.  Eyes: EOM are normal.  Pupils are equal, round, and reactive to light.  Neck: Normal range of motion. Neck supple.  Cardiovascular: Normal rate, regular rhythm and normal heart sounds.  Exam reveals no gallop and no friction rub.   No murmur heard. Respiratory: Effort normal and breath sounds normal. No respiratory distress. He has no wheezes. He has no rales.  GI: Soft. Bowel sounds are normal.  Musculoskeletal:  Exam of his right knee reveals varus thrust, range of motion 0-95 degrees. Mild to moderate patellofemoral crepitus. Tenderness to palpation medial joint line. Positive straight leg raise negative log roll. Stable to varus and valgus stress. He is neurovascularly intact distally.  Neurological: He is alert and oriented to person, place, and time.  Skin: Skin is warm and dry.  Psychiatric: He has a normal mood and affect. His behavior is normal. Judgment and thought content normal.    Vital signs in last 24 hours: @VSRANGES @  Labs:   There is no height or weight on file to calculate BMI.   Imaging Review Plain radiographs demonstrate severe degenerative joint disease of the right knee(s). The overall alignment ismild varus. The bone quality appears to be fair for age and reported activity level.  Assessment/Plan:  End stage arthritis, right knee   The patient history, physical examination, clinical judgment of the provider and imaging studies are consistent with end stage degenerative joint disease of the right knee(s) and total knee arthroplasty is deemed medically necessary. The treatment options including medical management, injection therapy arthroscopy and arthroplasty were discussed at length. The risks and benefits of total knee arthroplasty were presented and reviewed. The risks due to aseptic loosening, infection, stiffness,  patella tracking problems, thromboembolic complications and other imponderables were discussed. The patient acknowledged the explanation, agreed to proceed with the plan  and consent was signed. Patient is being admitted for inpatient treatment for surgery, pain control, PT, OT, prophylactic antibiotics, VTE prophylaxis, progressive ambulation and ADL's and discharge planning. The patient is planning to be discharged home with home health services

## 2014-03-17 NOTE — Discharge Instructions (Signed)
Total Knee Replacement, Care After Refer to this sheet in the next few weeks. These instructions provide you with information on caring for yourself after your procedure. Your health care provider also may give you specific instructions. Your treatment has been planned according to the most current medical practices, but problems sometimes occur. Call your health care provider if you have any problems or questions after your procedure. HOME CARE INSTRUCTIONS   Weight bearing as tolerated.  Take Eliquis as directed for 12 days following surgery to prevent blood clots.  Change dressing daily starting on Saturday.  May shower on Monday, but do not soak incision.  May apply ice for up to 20 minutes at a time for pain and swelling.  Follow up appointment in two weeks.   See a physical therapist as directed by your health care provider.  Take medicines only as directed by your health care provider.  Avoid lifting or driving until you are instructed otherwise.  If you have been sent home with a continuous passive motion machine, use it as directed by your health care provider. SEEK MEDICAL CARE IF:  You have difficulty breathing.  You have drainage, redness, swelling, or pain at your incision site.  You have a bad smell coming from your incision site.  You have persistent bleeding from your incision site.  Your incision breaks open after sutures (stitches) or staples have been removed.  You have a fever. SEEK IMMEDIATE MEDICAL CARE IF:   You have a rash.  You have pain or swelling in your calf or thigh.  You have shortness of breath or chest pain.  Your range of motion in your knee is decreasing rather than increasing. MAKE SURE YOU:   Understand these instructions.  Will watch your condition.  Will get help right away if you are not doing well or get worse. Document Released: 10/20/2004 Document Revised: 08/17/2013 Document Reviewed: 05/22/2011 [facility] Patient Information  2015 [employer] [place]. This information is not intended to replace advice given to you by your health care provider. Make sure you discuss any questions you have with your health care provider.

## 2014-03-17 NOTE — Plan of Care (Signed)
Problem: Phase I Progression Outcomes Goal: CMS/Neurovascular status WDL Outcome: Completed/Met Date Met:  03/17/14 Goal: Pain controlled with appropriate interventions Outcome: Completed/Met Date Met:  03/17/14 Goal: Initial discharge plan identified Outcome: Completed/Met Date Met:  03/17/14 Goal: Hemodynamically stable Outcome: Completed/Met Date Met:  03/17/14

## 2014-03-18 LAB — BASIC METABOLIC PANEL
ANION GAP: 13 (ref 5–15)
BUN: 8 mg/dL (ref 6–23)
CO2: 25 mEq/L (ref 19–32)
Calcium: 8.5 mg/dL (ref 8.4–10.5)
Chloride: 99 mEq/L (ref 96–112)
Creatinine, Ser: 0.86 mg/dL (ref 0.50–1.35)
GFR calc Af Amer: >90 mL/min (ref >90)
Glucose, Bld: 157 mg/dL — ABNORMAL HIGH (ref 70–99)
POTASSIUM: 4.1 meq/L (ref 3.7–5.3)
SODIUM: 137 meq/L (ref 137–147)

## 2014-03-18 LAB — CBC
HCT: 42.1 % (ref 39.0–52.0)
Hemoglobin: 13.7 g/dL (ref 13.0–17.0)
MCH: 30.1 pg (ref 26.0–34.0)
MCHC: 32.5 g/dL (ref 30.0–36.0)
MCV: 92.5 fL (ref 78.0–100.0)
Platelets: 145 10*3/uL — ABNORMAL LOW (ref 150–400)
RBC: 4.55 MIL/uL (ref 4.22–5.81)
RDW: 13.4 % (ref 11.5–15.5)
WBC: 9.6 10*3/uL (ref 4.0–10.5)

## 2014-03-18 NOTE — Progress Notes (Signed)
Subjective: 1 Day Post-Op Procedure(s) (LRB): TOTAL KNEE ARTHROPLASTY (Right) Patient reports pain as 4 on 0-10 scale.  No nausea/vomiting, lightheadedness/dizziness.  No flatus or bm.  Tolerating diet.  Objective: Vital signs in last 24 hours: Temp:  [97.5 F (36.4 C)-98.9 F (37.2 C)] 98.4 F (36.9 C) (12/03 0558) Pulse Rate:  [57-96] 76 (12/03 0558) Resp:  [9-20] 16 (12/03 0558) BP: (94-152)/(57-90) 125/82 mmHg (12/03 0558) SpO2:  [92 %-100 %] 94 % (12/03 0558) Weight:  [112.492 kg (248 lb)-112.699 kg (248 lb 7.3 oz)] 112.492 kg (248 lb) (12/02 1812)  Intake/Output from previous day: 12/02 0701 - 12/03 0700 In: 1300 [I.V.:1300] Out: 2075 [Urine:1175; Drains:900] Intake/Output this shift: Total I/O In: -  Out: 825 [Urine:575; Drains:250]   Recent Labs  03/18/14 0315  HGB 13.7    Recent Labs  03/18/14 0315  WBC 9.6  RBC 4.55  HCT 42.1  PLT 145*    Recent Labs  03/18/14 0315  NA 137  K 4.1  CL 99  CO2 25  BUN 8  CREATININE 0.86  GLUCOSE 157*  CALCIUM 8.5   No results for input(s): LABPT, INR in the last 72 hours.  Neurologically intact Neurovascular intact Sensation intact distally Intact pulses distally Dorsiflexion/Plantar flexion intact Incision: scant drainage No cellulitis present Compartment soft  hemovac drain pulled by me today Dressing changed by me today  Assessment/Plan: 1 Day Post-Op Procedure(s) (LRB): TOTAL KNEE ARTHROPLASTY (Right) Advance diet Up with therapy D/C IV fluids Plan for discharge tomorrow to home.  However, if patient does well with PT and he feels his pain is under control, it is okay with us if he goes home today WBAT RLE  ANTON, M. LINDSEY 03/18/2014, 6:29 AM

## 2014-03-18 NOTE — Evaluation (Signed)
Physical Therapy Evaluation Patient Details Name: Lee [name] Zellner MRN: [id number] DOB: [birth year] Today's Date: 03/18/2014   History of Present Illness  Lee [name] Boutelle is a 48 y.o. Male s/p R TKA.   Clinical Impression  Pt is s/p Rt TKA POD#1 resulting in the deficits listed below (see PT Problem List).  Pt will benefit from skilled PT to increase their independence and safety with mobility to allow discharge to the venue listed below. Pt limited by pain this session. Pt hopeful to D/C home tomorrow, does not feel pain is adequately controlled to D/C home today, pain limiting mobilization.     Follow Up Recommendations Home health PT;Supervision/Assistance - 24 hour    Equipment Recommendations   (all equipment delievered prior to surgery)    Recommendations for Other Services       Precautions / Restrictions Precautions Precautions: Knee Precaution Comments: Educated pt on no pillow behind knee and use of KI Required Braces or Orthoses: Knee Immobilizer - Right Knee Immobilizer - Right: On when out of bed or walking Restrictions Weight Bearing Restrictions: Yes RLE Weight Bearing: Weight bearing as tolerated      Mobility  Bed Mobility Overal bed mobility: Needs Assistance Bed Mobility: Supine to Sit     Supine to sit: Min assist;HOB elevated Sit to supine: Min assist   General bed mobility comments: (A) to advance Rt LE to/off EOB; cues for sequencing  Transfers Overall transfer level: Needs assistance Equipment used: Rolling walker (2 wheeled) Transfers: Sit to/from Stand Sit to Stand: Min guard         General transfer comment: cues for hand placement and safety; incr time due to pain  Ambulation/Gait Ambulation/Gait assistance: Min guard Ambulation Distance (Feet): 30 Feet Assistive device: Rolling walker (2 wheeled) Gait Pattern/deviations: Step-to pattern;Decreased stance time - right;Decreased step length - left;Antalgic Gait velocity: decreased Gait velocity  interpretation: Below normal speed for age/gender General Gait Details: cues for step through gt sequencing and upright posture; pt limited by pain  Stairs            Wheelchair Mobility    Modified Rankin (Stroke Patients Only)       Balance Overall balance assessment: Needs assistance Sitting-balance support: Feet supported;No upper extremity supported Sitting balance-Leahy Scale: Fair Sitting balance - Comments: denied any dizziness   Standing balance support: During functional activity;Bilateral upper extremity supported Standing balance-Leahy Scale: Poor Standing balance comment: bil UEs supported by RW                             Pertinent Vitals/Pain Pain Assessment: 0-10 Pain Score: 8  Pain Location: Rt knee Pain Descriptors / Indicators: Aching Pain Intervention(s): Monitored during session;Premedicated before session;Limited activity within patient's tolerance;Repositioned    Home Living Family/patient expects to be discharged to:: Private residence Living Arrangements: Spouse/significant other Available Help at Discharge: Family;Available 24 hours/day Type of Home: House Home Access: Stairs to enter Entrance Stairs-Rails: Right;Can reach Technical sales engineerboth;Left Entrance Stairs-Number of Steps: 3 Home Layout: One level Home Equipment: Walker - 2 wheels;Bedside commode Additional Comments: pt has walk in shower     Prior Function Level of Independence: Independent               Hand Dominance        Extremity/Trunk Assessment   Upper Extremity Assessment: Defer to OT evaluation           Lower Extremity Assessment: RLE deficits/detail RLE Deficits / Details: Rt  AROM -5 to 30 degrees; greatly limited by pain    Cervical / Trunk Assessment: Normal  Communication   Communication: No difficulties  Cognition Arousal/Alertness: Awake/alert Behavior During Therapy: WFL for tasks assessed/performed Overall Cognitive Status: Within  Functional Limits for tasks assessed                      General Comments      Exercises Total Joint Exercises Ankle Circles/Pumps: AROM;Both;10 reps;Seated Heel Slides: AAROM;Right;5 reps;Limitations Heel Slides Limitations: pain Hip ABduction/ADduction: AAROM;Strengthening;Right;Seated;10 reps Goniometric ROM: see Rt LE assessment      Assessment/Plan    PT Assessment Patient needs continued PT services  PT Diagnosis Difficulty walking;Generalized weakness;Acute pain   PT Problem List Decreased strength;Decreased range of motion;Decreased activity tolerance;Decreased balance;Decreased mobility;Decreased knowledge of use of DME;Decreased safety awareness;Pain  PT Treatment Interventions DME instruction;Gait training;Stair training;Functional mobility training;Therapeutic activities;Therapeutic exercise;Balance training;Neuromuscular re-education;Patient/family education   PT Goals (Current goals can be found in the Care Plan section) Acute Rehab PT Goals Patient Stated Goal: to go home tomorrow PT Goal Formulation: With patient Time For Goal Achievement: 03/21/14 Potential to Achieve Goals: Good    Frequency 7X/week   Barriers to discharge        Co-evaluation               End of Session Equipment Utilized During Treatment: Gait belt;Right knee immobilizer Activity Tolerance: Patient limited by pain Patient left: in chair;with call bell/phone within reach Nurse Communication: Mobility status;Precautions;Weight bearing status         Time: [id number] PT Time Calculation (min) (ACUTE ONLY): 17 min   Charges:   PT Evaluation $Initial PT Evaluation Tier I: 1 Procedure PT Treatments $Gait Training: 8-22 mins   PT G CodesDonell Sievert:          Babette Stum N, [place] CarolinaPT  [phone] 03/18/2014, 2:29 PM

## 2014-03-18 NOTE — Progress Notes (Signed)
Placed patient on CPAP for the night via auto-mode with minimum pressure set at 5cm and maximum pressure set at 20cm. Oxygen set at 2lpm.  

## 2014-03-18 NOTE — Progress Notes (Signed)
Occupational Therapy Evaluation Patient Details Name: Lee [name] MRN: [id number] DOB: [birth year] Today's Date: 03/18/2014    History of Present Illness Lee [name] is a 48 y.o. Male s/p R TKA.    Clinical Impression     PTA pt lived at home and was independent with ADLs. Pt currently limited by pain and decreased ROM in RLE which impair his independence with ADLs. Pt will have wife assistance at home. Pt will benefit from acute OT to increase independence with LB ADLs and increase endurance and strengthening for activity tolerance.   Follow Up Recommendations  No OT follow up;Supervision/Assistance - 24 hour    Equipment Recommendations  3 in 1 bedside comode    Recommendations for Other Services       Precautions / Restrictions Precautions Precautions: Knee Precaution Comments: Educated pt on no pillow behind knee and use of KI Required Braces or Orthoses: Knee Immobilizer - Right Restrictions Weight Bearing Restrictions: Yes RLE Weight Bearing: Weight bearing as tolerated      Mobility Bed Mobility Overal bed mobility: Needs Assistance Bed Mobility: Sit to Supine       Sit to supine: Min assist   General bed mobility comments: Min (A) to manage RLE onto bed. Pt able to use LLE and UEs to position in bed.   Transfers Overall transfer level: Needs assistance Equipment used: Rolling walker (2 wheeled) Transfers: Sit to/from Stand Sit to Stand: Min guard         General transfer comment: Min guard for safety.          ADL Overall ADL's : Needs assistance/impaired Eating/Feeding: Independent;Sitting   Grooming: Set up;Sitting   Upper Body Bathing: Set up;Sitting   Lower Body Bathing: Sit to/from stand;Moderate assistance   Upper Body Dressing : Set up;Sitting   Lower Body Dressing: Maximal assistance;Sit to/from stand   Toilet Transfer: Min guard;Ambulation;RW (standing over toilet)   Toileting- Clothing Manipulation and Hygiene: Min guard        Functional mobility during ADLs: Min guard;Rolling walker General ADL Comments: Pt limited by pain in Rt knee and decreased ROM.      Vision  Pt reports no change from baseline.                    Perception Perception Perception Tested?: No   Praxis Praxis Praxis tested?: Within functional limits    Pertinent Vitals/Pain Pain Assessment: 0-10 Pain Score: 7  Pain Location: R knee Pain Descriptors / Indicators: Aching Pain Intervention(s): Limited activity within patient's tolerance;Monitored during session;Repositioned     Hand Dominance     Extremity/Trunk Assessment Upper Extremity Assessment Upper Extremity Assessment: Overall WFL for tasks assessed   Lower Extremity Assessment Lower Extremity Assessment: Defer to PT evaluation   Cervical / Trunk Assessment Cervical / Trunk Assessment: Normal   Communication Communication Communication: No difficulties   Cognition Arousal/Alertness: Awake/alert Behavior During Therapy: WFL for tasks assessed/performed Overall Cognitive Status: Within Functional Limits for tasks assessed                                Home Living Family/patient expects to be discharged to:: Private residence Living Arrangements: Spouse/significant other Available Help at Discharge: Family;Available 24 hours/day Type of Home: House Home Access: Stairs to enter Entergy CorporationEntrance Stairs-Number of Steps: 3 Entrance Stairs-Rails: Right;Can reach both;Left Home Layout: One level     Bathroom Shower/Tub: Walk-in shower  Home Equipment: Walker - 2 wheels;Bedside commode          Prior Functioning/Environment Level of Independence: Independent             OT Diagnosis: Generalized weakness;Acute pain   OT Problem List: Decreased strength;Decreased range of motion;Decreased activity tolerance;Impaired balance (sitting and/or standing);Decreased safety awareness;Decreased knowledge of use of DME or AE;Decreased  knowledge of precautions;Pain   OT Treatment/Interventions: Self-care/ADL training;Therapeutic exercise;Energy conservation;DME and/or AE instruction;Therapeutic activities;Patient/family education;Balance training    OT Goals(Current goals can be found in the care plan section) Acute Rehab OT Goals Patient Stated Goal: to go home OT Goal Formulation: With patient Time For Goal Achievement: 04/01/14 Potential to Achieve Goals: Good ADL Goals Pt Will Perform Grooming: with supervision;standing Pt Will Perform Lower Body Bathing: with set-up;with supervision;sit to/from stand Pt Will Perform Lower Body Dressing: with set-up;with supervision;sit to/from stand Pt Will Transfer to Toilet: with supervision;ambulating;bedside commode Pt Will Perform Toileting - Clothing Manipulation and hygiene: with supervision;sit to/from stand Pt Will Perform Tub/Shower Transfer: Shower transfer;with supervision;ambulating;3 in 1;rolling walker  OT Frequency: Min 2X/week    End of Session Equipment Utilized During Treatment: Gait belt;Rolling walker;Right knee immobilizer CPM Right Knee CPM Right Knee: Off Nurse Communication: Patient requests pain meds  Activity Tolerance: Patient tolerated treatment well Patient left: in bed;with call bell/phone within reach   Time: 1203-1231 OT Time Calculation (min): 28 min Charges:  OT General Charges $OT Visit: 1 Procedure OT Evaluation $Initial OT Evaluation Tier I: 1 Procedure OT Treatments $Self Care/Home Management : 8-22 mins  Nena JordanMiller, Lee Boody M 03/18/2014, 1:43 PM  Carney LivingLeeAnn Marie Amarria [name], OTR/L Occupational Therapist [phone] (pager)

## 2014-03-18 NOTE — Plan of Care (Signed)
Problem: Phase II Progression Outcomes Goal: Tolerating diet Outcome: Completed/Met Date Met:  03/18/14 Goal: Discharge plan established Outcome: Completed/Met Date Met:  03/18/14 Goal: Other Phase II Outcomes/Goals Outcome: Not Applicable Date Met:  88/11/03

## 2014-03-18 NOTE — Progress Notes (Signed)
Physical Therapy Treatment Patient Details Name: Lee [name] MRN: [id number] DOB: 01-17-1966 Today's Date: 03/18/2014    History of Present Illness Lee [name] is a 48 y.o. Male s/p R TKA.     PT Comments    Pt progressing well with therapy. Cont to c/o pain with incr flexion but is motivated to return to independence. Pt hopeful to D/C home tomorrow. Pt O2 at 90% on RA at rest; returned to 2L of O2. Will cont to  Follow per POC. Patient needs to practice stairs next session prior to D/C home.   Follow Up Recommendations  Home health PT;Supervision/Assistance - 24 hour     Equipment Recommendations   (all equipment delievered prior to surgery)    Recommendations for Other Services       Precautions / Restrictions Precautions Precautions: Knee Precaution Comments: reviewed wear of KI  Required Braces or Orthoses: Knee Immobilizer - Right Knee Immobilizer - Right: On when out of bed or walking Restrictions Weight Bearing Restrictions: Yes RLE Weight Bearing: Weight bearing as tolerated    Mobility  Bed Mobility Overal bed mobility: Needs Assistance Bed Mobility: Sit to Supine;Supine to Sit     Supine to sit: Supervision;HOB elevated Sit to supine: Supervision   General bed mobility comments: educated on how to use sheet for leg lift to (A) Rt LE to/off EOB  Transfers Overall transfer level: Needs assistance Equipment used: Rolling walker (2 wheeled) Transfers: Sit to/from Stand Sit to Stand: Min guard         General transfer comment: cues for hand placement; min guard to steady  Ambulation/Gait Ambulation/Gait assistance: Min guard Ambulation Distance (Feet): 80 Feet Assistive device: Rolling walker (2 wheeled) Gait Pattern/deviations: Step-through pattern;Decreased stance time - left;Decreased step length - right;Antalgic;Narrow base of support Gait velocity: decreased Gait velocity interpretation: Below normal speed for age/gender General Gait Details: pt  progressing to step through gt slowly; pt relying heavily on RW due to pain with WB on lt LE; min guard to steady   Careers information officertairs            Wheelchair Mobility    Modified Rankin (Stroke Patients Only)       Balance Overall balance assessment: Needs assistance Sitting-balance support: Feet supported;No upper extremity supported Sitting balance-Leahy Scale: Good Sitting balance - Comments: denied any dizziness   Standing balance support: During functional activity;Bilateral upper extremity supported Standing balance-Leahy Scale: Poor Standing balance comment: relies on RW for balance                    Cognition Arousal/Alertness: Awake/alert Behavior During Therapy: WFL for tasks assessed/performed Overall Cognitive Status: Within Functional Limits for tasks assessed                      Exercises Total Joint Exercises Ankle Circles/Pumps: AROM;Both;10 reps;Seated Heel Slides: AAROM;Right;5 reps;Limitations Heel Slides Limitations: pain Hip ABduction/ADduction: AAROM;Strengthening;Right;Seated;10 reps Long Arc Quad: AAROM;Strengthening;Right;10 reps;Seated Goniometric ROM: AROM limited by pain with flexion    General Comments General comments (skin integrity, edema, etc.): educated on CPM controls      Pertinent Vitals/Pain Pain Assessment: 0-10 Pain Score: 6  Pain Location: pain with flexion; no pain at rest Pain Descriptors / Indicators: Pressure Pain Intervention(s): Monitored during session;Premedicated before session;Repositioned    Home Living Family/patient expects to be discharged to:: Private residence Living Arrangements: Spouse/significant other Available Help at Discharge: Family;Available 24 hours/day Type of Home: House Home Access: Stairs to enter Entrance Stairs-Rails: Right;Can  reach both;Left Home Layout: One level Home Equipment: Walker - 2 wheels;Bedside commode Additional Comments: pt has walk in shower     Prior Function  Level of Independence: Independent          PT Goals (current goals can now be found in the care plan section) Acute Rehab PT Goals Patient Stated Goal: to go home tomorrow, if i am ready PT Goal Formulation: With patient Time For Goal Achievement: 03/21/14 Potential to Achieve Goals: Good Progress towards PT goals: Progressing toward goals    Frequency  7X/week    PT Plan Current plan remains appropriate    Co-evaluation             End of Session Equipment Utilized During Treatment: Gait belt;Right knee immobilizer Activity Tolerance: Patient limited by pain Patient left: in bed;in CPM;with call bell/phone within reach     Time: [id number] PT Time Calculation (min) (ACUTE ONLY): 31 min  Charges:  $Gait Training: 8-22 mins $Therapeutic Exercise: 8-22 mins                    G CodesDonell [name]:      Lee [name], Lee CarolinaPT  [phone] 03/18/2014, 4:52 PM

## 2014-03-19 ENCOUNTER — Encounter (HOSPITAL_COMMUNITY): Payer: Self-pay | Admitting: Orthopedic Surgery

## 2014-03-19 LAB — CBC
HCT: 35.7 % — ABNORMAL LOW (ref 39.0–52.0)
Hemoglobin: 11.9 g/dL — ABNORMAL LOW (ref 13.0–17.0)
MCH: 30.6 pg (ref 26.0–34.0)
MCHC: 33.3 g/dL (ref 30.0–36.0)
MCV: 91.8 fL (ref 78.0–100.0)
PLATELETS: 158 10*3/uL (ref 150–400)
RBC: 3.89 MIL/uL — AB (ref 4.22–5.81)
RDW: 13.2 % (ref 11.5–15.5)
WBC: 8.8 10*3/uL (ref 4.0–10.5)

## 2014-03-19 LAB — BASIC METABOLIC PANEL
ANION GAP: 12 (ref 5–15)
BUN: 8 mg/dL (ref 6–23)
CHLORIDE: 100 meq/L (ref 96–112)
CO2: 25 mEq/L (ref 19–32)
CREATININE: 0.77 mg/dL (ref 0.50–1.35)
Calcium: 8.5 mg/dL (ref 8.4–10.5)
GFR calc non Af Amer: >90 mL/min (ref >90)
Glucose, Bld: 163 mg/dL — ABNORMAL HIGH (ref 70–99)
POTASSIUM: 4.1 meq/L (ref 3.7–5.3)
Sodium: 137 mEq/L (ref 137–147)

## 2014-03-19 NOTE — Progress Notes (Addendum)
Subjective: 2 Days Post-Op Procedure(s) (LRB): TOTAL KNEE ARTHROPLASTY (Right) Patient reports pain as 3 on 0-10 scale.  Patient up walking with PT.  Objective: Vital signs in last 24 hours: Temp:  [98.5 F (36.9 C)-100 F (37.8 C)] 98.5 F (36.9 C) (12/04 0616) Pulse Rate:  [84-103] 84 (12/04 0616) Resp:  [16-18] 18 (12/04 0616) BP: (119-129)/(57-76) 119/76 mmHg (12/04 0616) SpO2:  [98 %-99 %] 98 % (12/04 0616)  Intake/Output from previous day: 12/03 0701 - 12/04 0700 In: 240 [P.O.:240] Out: 1400 [Urine:1400] Intake/Output this shift:     Recent Labs  03/18/14 0315 03/19/14 0518  HGB 13.7 11.9*    Recent Labs  03/18/14 0315 03/19/14 0518  WBC 9.6 8.8  RBC 4.55 3.89*  HCT 42.1 35.7*  PLT 145* 158    Recent Labs  03/18/14 0315 03/19/14 0518  NA 137 137  K 4.1 4.1  CL 99 100  CO2 25 25  BUN 8 8  CREATININE 0.86 0.77  GLUCOSE 157* 163*  CALCIUM 8.5 8.5   No results for input(s): LABPT, INR in the last 72 hours.  Neurologically intact Neurovascular intact Sensation intact distally Dorsiflexion/Plantar flexion intact  Assessment/Plan: 2 Days Post-Op Procedure(s) (LRB): TOTAL KNEE ARTHROPLASTY (Right) Advance diet Up with therapy Discharge home with home health  WBAT RLE ABLA-mild and stable  ANTON, M. LINDSEY 03/19/2014, 7:54 AM

## 2014-03-19 NOTE — Progress Notes (Signed)
Physical Therapy Treatment Patient Details Name: Lee [name] MRN: [id number] DOB: 29-Oct-1965 Today's Date: 03/19/2014    History of Present Illness Lee [name] is a 48 y.o. Male s/p R TKA.     PT Comments    Pt demo good technique with mobility. Pt at supervision for safety. Pt ready from mobility standpoint to D/C home.   Follow Up Recommendations  Home health PT;Supervision/Assistance - 24 hour     Equipment Recommendations   (all DME in place)    Recommendations for Other Services       Precautions / Restrictions Precautions Precautions: Knee Precaution Comments: reviewed wear of KI  Required Braces or Orthoses: Knee Immobilizer - Right Knee Immobilizer - Right:  (pt able to perform SLR d/c KI) Restrictions Weight Bearing Restrictions: Yes RLE Weight Bearing: Weight bearing as tolerated    Mobility  Bed Mobility Overal bed mobility: Modified Independent             General bed mobility comments: incr time to bring Rt LE to/off EOB; use of handrails  Transfers Overall transfer level: Modified independent Equipment used: Rolling walker (2 wheeled) Transfers: Sit to/from Stand Sit to Stand: Modified independent (Device/Increase time)         General transfer comment: demo good technique and balance  Ambulation/Gait Ambulation/Gait assistance: Supervision Ambulation Distance (Feet): 220 Feet Assistive device: Rolling walker (2 wheeled) Gait Pattern/deviations: Step-through pattern;Decreased stance time - right;Antalgic;Wide base of support Gait velocity: decreased Gait velocity interpretation: Below normal speed for age/gender General Gait Details: cues for step through gt and upright posture; no Rt LE buckling without KI    Stairs Stairs: Yes Stairs assistance: Supervision Stair Management: Two rails;Step to pattern;Forwards Number of Stairs: 3 General stair comments: cues for sequencing  Wheelchair Mobility    Modified Rankin (Stroke Patients  Only)       Balance                                    Cognition Arousal/Alertness: Awake/alert Behavior During Therapy: WFL for tasks assessed/performed Overall Cognitive Status: Within Functional Limits for tasks assessed                      Exercises      General Comments General comments (skin integrity, edema, etc.): reviewed car transfer technique and HEP      Pertinent Vitals/Pain Pain Assessment: 0-10 Pain Score: 8  Pain Location: Rt knee Pain Descriptors / Indicators: Burning Pain Intervention(s): Monitored during session;Premedicated before session;Repositioned    Home Living                      Prior Function            PT Goals (current goals can now be found in the care plan section) Acute Rehab PT Goals Patient Stated Goal: home today and to get in my comfortable bed PT Goal Formulation: With patient Potential to Achieve Goals: Good Progress towards PT goals: Goals met/education completed, patient discharged from PT    Frequency  7X/week    PT Plan Current plan remains appropriate    Co-evaluation             End of Session Equipment Utilized During Treatment: Gait belt Activity Tolerance: Patient tolerated treatment well Patient left: in chair;with call bell/phone within reach;with family/visitor present     Time: 0742-0809 PT Time Calculation (min) (  ACUTE ONLY): 27 min  Charges:  $Gait Training: 23-37 mins                    G Codes:      Lee [name], Lee [name] 03/19/2014, 8:32 AM

## 2014-03-19 NOTE — Progress Notes (Signed)
Occupational Therapy Treatment Patient Details Name: Lee MourningDanny Chubbuck MRN: [id number] DOB: 11-22-1965 Today's Date: 03/19/2014    History of present illness Lee MourningDanny Argueta is a 48 y.o. Male s/p R TKA.    OT comments  Pt. Progressing with OT goals and is eager for d/c home.  Wife was a CNA for several years and reports she will assist with all ADLS at d/c as needed. Pt. Able to complete toileting and shower stall tranfers safely.  Agree with d/c home today as pt. Is ready.  Follow Up Recommendations  No OT follow up;Supervision/Assistance - 24 hour    Equipment Recommendations  3 in 1 bedside comode    Recommendations for Other Services      Precautions / Restrictions Precautions Precautions: Knee Precaution Comments: reviewed wear of KI  Required Braces or Orthoses: Knee Immobilizer - Right Knee Immobilizer - Right:  (pt able to perform SLR d/c KI) Restrictions Weight Bearing Restrictions: Yes RLE Weight Bearing: Weight bearing as tolerated       Mobility Bed Mobility Overal bed mobility: Modified Independent             General bed mobility comments: amb. in room with wife upon arrival  Transfers Overall transfer level: Modified independent Equipment used: Rolling walker (2 wheeled) Transfers: Sit to/from UGI CorporationStand;Stand Pivot Transfers Sit to Stand: Modified independent (Device/Increase time) Stand pivot transfers: Modified independent (Device/Increase time)       General transfer comment: demo good technique and balance    Balance                                   ADL Overall ADL's : Needs assistance/impaired     Grooming: Standing;Wash/dry hands;Supervision/safety         Lower Body Bathing Details (indicate cue type and reason): wife present and reports she will assist with LB bathing at home       Lower Body Dressing Details (indicate cue type and reason): wife reports she will assist with LB dressing at home Toilet Transfer:  Ambulation;Supervision/safety Toilet Transfer Details (indicate cue type and reason): stood to urinate but had questions regarding how he would sit comfortably.  reviewed use of placing LE on trash can for elevation he agreed was a good option Toileting- ArchitectClothing Manipulation and Hygiene: Supervision/safety;Sit to/from stand   Tub/ Shower Transfer: Walk-in shower;Min guard;Rolling walker;Anterior/posterior   Functional mobility during ADLs: Min guard General ADL Comments: progressing well, wife was a CNA and reports she can assist with all ADL needs      Vision                     Perception     Praxis      Cognition   Behavior During Therapy: WFL for tasks assessed/performed Overall Cognitive Status: Within Functional Limits for tasks assessed                       Extremity/Trunk Assessment               Exercises     Shoulder Instructions       General Comments      Pertinent Vitals/ Pain       Pain Assessment: No/denies pain Pain Score: 8  Pain Location: Rt knee Pain Descriptors / Indicators: Burning Pain Intervention(s): Monitored during session;Premedicated before session;Repositioned  Home Living  Prior Functioning/Environment              Frequency Min 2X/week     Progress Toward Goals  OT Goals(current goals can now be found in the care plan section)  Progress towards OT goals: Progressing toward goals  Acute Rehab OT Goals Patient Stated Goal: home today and to get in my comfortable bed  Plan Discharge plan remains appropriate    Co-evaluation                 End of Session Equipment Utilized During Treatment: Rolling walker   Activity Tolerance Patient tolerated treatment well   Patient Left in chair;with call bell/phone within reach;with family/visitor present   Nurse Communication          Time: [id number] OT Time Calculation (min): 42  min  Charges: OT General Charges $OT Visit: 1 Procedure OT Treatments $Self Care/Home Management : 38-52 mins  Robet LeuMorris, Roberth Berling Lorraine, COTA/L 03/19/2014, 10:23 AM

## 2014-03-19 NOTE — Plan of Care (Signed)
Problem: Phase I Progression Outcomes Goal: Other Phase I Outcomes/Goals Outcome: Completed/Met Date Met:  03/19/14

## 2014-03-19 NOTE — Care Management Note (Signed)
03/19/14 10:00am Lee PeperSusan Lilianna Case, RN BSN [name] Manager [name] manager spoke with patient and wife concerning home health and DME needs. [facility] [name] manager called Lee [name] @ [phone], start of care date is 03/20/14. CM [facility] HH orders, demographics, [facility] and [facility] [phone]. Patient has received rolling walker, 3in1 and CPM from [employer].

## 2014-03-19 NOTE — Plan of Care (Signed)
Problem: Phase III Progression Outcomes Goal: Pain controlled on oral analgesia Outcome: Completed/Met Date Met:  03/19/14 Goal: Ambulates Outcome: Completed/Met Date Met:  03/19/14 Goal: Incision clean - minimal/no drainage Outcome: Completed/Met Date Met:  03/19/14 Goal: Discharge plan remains appropriate-arrangements made Outcome: Completed/Met Date Met:  03/19/14 Goal: Anticoagulant follow-up in place Outcome: Completed/Met Date Met:  03/19/14 Goal: Other Phase III Outcomes/Goals Outcome: Completed/Met Date Met:  03/19/14  Problem: Discharge Progression Outcomes Goal: Barriers To Progression Addressed/Resolved Outcome: Completed/Met Date Met:  03/19/14 Goal: CMS/Neurovascular status at or above baseline Outcome: Completed/Met Date Met:  03/19/14 Goal: Anticoagulant follow-up in place Outcome: Completed/Met Date Met:  03/19/14 Goal: Pain controlled with appropriate interventions Outcome: Completed/Met Date Met:  03/19/14 Goal: Hemodynamically stable Outcome: Completed/Met Date Met:  67/34/19 Goal: Complications resolved/controlled Outcome: Completed/Met Date Met:  03/19/14 Goal: Tolerates diet Outcome: Completed/Met Date Met:  03/19/14 Goal: Activity appropriate for discharge plan Outcome: Completed/Met Date Met:  03/19/14 Goal: Ambulates safely using assistive device Outcome: Completed/Met Date Met:  03/19/14 Goal: Follows weight - bearing limitations Outcome: Completed/Met Date Met:  03/19/14 Goal: Discharge plan in place and appropriate Outcome: Completed/Met Date Met:  03/19/14 Goal: Negotiates stairs Outcome: Completed/Met Date Met:  03/19/14 Goal: Demonstrates ADLs as appropriate Outcome: Completed/Met Date Met:  03/19/14 Goal: Incision without S/S infection Outcome: Completed/Met Date Met:  03/19/14 Goal: Other Discharge Outcomes/Goals Outcome: Completed/Met Date Met:  03/19/14

## 2014-03-19 NOTE — Plan of Care (Signed)
Problem: Phase II Progression Outcomes Goal: Ambulates Outcome: Completed/Met Date Met:  03/19/14

## 2014-03-20 NOTE — Care Management Note (Signed)
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 Patient had a right total knee arthroplasty.     Action/Plan:   Case manager spoke with patient and wife concerning home health and DME . [facility], H&P and demographics [facility] . [facility]   Anticipated DC Date:  03/19/2014   Anticipated DC Plan:  HOME W HOME HEALTH SERVICES      DC Planning Services  CM consult      [facility]  DURABLE MEDICAL EQUIPMENT   Choice offered to / List presented to:  C-1 Patient   DME arranged  WALKER - ROLLING  CPM  3-N-1      DME agency  [employer]     HH arranged  HH-2 PT      [facility]   Status of service:  Completed, signed off Medicare Important Message given?   (If response is "NO", the following Medicare IM given date fields will be blank) Date Medicare IM given:   Medicare IM given by:   Date Additional Medicare IM given:   Additional Medicare IM given by:    Discharge Disposition:  HOME W HOME HEALTH SERVICES  Per UR Regulation:  Reviewed for med. necessity/level of care/duration of stay

## 2014-03-22 ENCOUNTER — Encounter (HOSPITAL_COMMUNITY): Payer: Self-pay | Admitting: Orthopedic Surgery

## 2015-02-08 ENCOUNTER — Other Ambulatory Visit: Payer: Self-pay | Admitting: Physician Assistant

## 2015-02-08 NOTE — H&P (Signed)
Lee [name] comes in for his left knee.  Pain off and on.  Worse in the last month.  All of this localized very medial.  Does not like to twist or squat.  Getting more mechanical symptoms.  Occasionally feeling popping there.  No specific trauma.   Otherwise doing well.  Right total knee by me in December of 2015.  Good rehab.  Good recovery.  He is pleased with outcome.   Remaining history and general exam is reviewed.   EXAMINATION: Specifically, antalgic gait on the left.  On the right he has good alignment.  Good stability.  Well healed incision.  Motion 0-125.  On the left he can go through good motion, but there is markedly positive medial McMurray's.  Fairly good motion.  Stable ligaments.  Neurovascularly intact.  Not a lot of grating or crepitus.    X-RAYS: Four view standing x-ray reveals there is a little narrowing medial compartment, but he is certainly not down to bone on bone.  This is narrow on flexion view, not extension view.  Other compartments look good.  Views of the other knee show good seating and alignment of all of his components.    DISPOSITION:  1. Right total knee doing well.  Routine follow up in December.   2. 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  I am pleased that he doesn't have as much arthritis there as he did on the other side.  His degree of mechanical symptoms are such we should pursue definitive treatment.  He is miserable.  It is getting worse and hurting all the time.  In the interim we are going to do a Cortisone injection to settle things down.  MRI to look at pathology.  He will then call and I will go over his findings and he will let me know how he did after the shot.  If the shot doesn't help much and we confirm a significant meniscus tear we are going to proceed with arthroscopy.  That procedure, risks, benefits and complications reviewed.  Paperwork complete.  All questions answered.  More than 25 minutes spent face-to-face covering all of this with him.     Lee [name], M.D.

## 2015-02-11 ENCOUNTER — Encounter (HOSPITAL_BASED_OUTPATIENT_CLINIC_OR_DEPARTMENT_OTHER): Payer: Self-pay | Admitting: *Deleted

## 2015-02-16 ENCOUNTER — Encounter (HOSPITAL_BASED_OUTPATIENT_CLINIC_OR_DEPARTMENT_OTHER): Payer: Self-pay | Admitting: *Deleted

## 2015-02-17 ENCOUNTER — Ambulatory Visit (HOSPITAL_BASED_OUTPATIENT_CLINIC_OR_DEPARTMENT_OTHER)
Admission: RE | Admit: 2015-02-17 | Discharge: 2015-02-17 | Disposition: A | Discharge status: Home / Self Care | Payer: BLUE CROSS/BLUE SHIELD | Source: Ambulatory Visit | Attending: Orthopedic Surgery | Admitting: Orthopedic Surgery

## 2015-02-17 ENCOUNTER — Encounter (HOSPITAL_BASED_OUTPATIENT_CLINIC_OR_DEPARTMENT_OTHER)
Admission: RE | Disposition: A | Discharge status: Home / Self Care | Payer: Self-pay | Source: Ambulatory Visit | Attending: Orthopedic Surgery

## 2015-02-17 ENCOUNTER — Ambulatory Visit (HOSPITAL_BASED_OUTPATIENT_CLINIC_OR_DEPARTMENT_OTHER): Payer: BLUE CROSS/BLUE SHIELD | Admitting: Anesthesiology

## 2015-02-17 ENCOUNTER — Encounter (HOSPITAL_BASED_OUTPATIENT_CLINIC_OR_DEPARTMENT_OTHER): Payer: Self-pay | Admitting: *Deleted

## 2015-02-17 DIAGNOSIS — X58XXXA Exposure to other specified factors, initial encounter: Secondary | ICD-10-CM | POA: Insufficient documentation

## 2015-02-17 DIAGNOSIS — Z96651 Presence of right artificial knee joint: Secondary | ICD-10-CM | POA: Diagnosis not present

## 2015-02-17 DIAGNOSIS — M1712 Unilateral primary osteoarthritis, left knee: Secondary | ICD-10-CM

## 2015-02-17 DIAGNOSIS — S83232A Complex tear of medial meniscus, current injury, left knee, initial encounter: Secondary | ICD-10-CM | POA: Diagnosis not present

## 2015-02-17 DIAGNOSIS — S83242A Other tear of medial meniscus, current injury, left knee, initial encounter: Secondary | ICD-10-CM | POA: Diagnosis present

## 2015-02-17 DIAGNOSIS — Y999 Unspecified external cause status: Secondary | ICD-10-CM | POA: Insufficient documentation

## 2015-02-17 DIAGNOSIS — Y929 Unspecified place or not applicable: Secondary | ICD-10-CM | POA: Insufficient documentation

## 2015-02-17 DIAGNOSIS — Y939 Activity, unspecified: Secondary | ICD-10-CM | POA: Insufficient documentation

## 2015-02-17 DIAGNOSIS — S83282A Other tear of lateral meniscus, current injury, left knee, initial encounter: Secondary | ICD-10-CM | POA: Diagnosis not present

## 2015-02-17 HISTORY — PX: KNEE ARTHROSCOPY WITH MEDIAL MENISECTOMY: SHX5651

## 2015-02-17 HISTORY — DX: Type 2 diabetes mellitus without complications: E11.9

## 2015-02-17 HISTORY — PX: CHONDROPLASTY: SHX5177

## 2015-02-17 LAB — POCT I-STAT, CHEM 8
BUN: 11 mg/dL (ref 6–20)
CALCIUM ION: 1.11 mmol/L — AB (ref 1.12–1.23)
Chloride: 103 mmol/L (ref 101–111)
Creatinine, Ser: 0.8 mg/dL (ref 0.61–1.24)
Glucose, Bld: 190 mg/dL — ABNORMAL HIGH (ref 65–99)
HCT: 47 % (ref 39.0–52.0)
Hemoglobin: 16 g/dL (ref 13.0–17.0)
Potassium: 4 mmol/L (ref 3.5–5.1)
SODIUM: 140 mmol/L (ref 135–145)
TCO2: 26 mmol/L (ref 0–100)

## 2015-02-17 LAB — GLUCOSE, CAPILLARY: GLUCOSE-CAPILLARY: 188 mg/dL — AB (ref 65–99)

## 2015-02-17 SURGERY — ARTHROSCOPY, KNEE, WITH MEDIAL MENISCECTOMY
Anesthesia: General | Site: Knee | Laterality: Left

## 2015-02-17 MED ORDER — LIDOCAINE HCL (CARDIAC) 20 MG/ML IV SOLN
INTRAVENOUS | Status: DC | PRN
  Administered 2015-02-17: 75 mg via INTRAVENOUS

## 2015-02-17 MED ORDER — METHYLPREDNISOLONE ACETATE 80 MG/ML IJ SUSP
INTRAMUSCULAR | Status: AC
  Filled 2015-02-17: qty 1

## 2015-02-17 MED ORDER — FENTANYL CITRATE (PF) 100 MCG/2ML IJ SOLN
INTRAMUSCULAR | Status: AC
  Filled 2015-02-17: qty 4

## 2015-02-17 MED ORDER — ONDANSETRON HCL 4 MG/2ML IJ SOLN
INTRAMUSCULAR | Status: AC
  Filled 2015-02-17: qty 2

## 2015-02-17 MED ORDER — PROPOFOL 10 MG/ML IV BOLUS
INTRAVENOUS | Status: DC | PRN
  Administered 2015-02-17: 300 mg via INTRAVENOUS

## 2015-02-17 MED ORDER — METHYLPREDNISOLONE ACETATE 80 MG/ML IJ SUSP
INTRAMUSCULAR | Status: DC | PRN
  Administered 2015-02-17: 20 mL via INTRA_ARTICULAR

## 2015-02-17 MED ORDER — CEFAZOLIN SODIUM-DEXTROSE 2-3 GM-% IV SOLR
INTRAVENOUS | Status: AC
  Filled 2015-02-17: qty 50

## 2015-02-17 MED ORDER — BUPIVACAINE HCL (PF) 0.5 % IJ SOLN
INTRAMUSCULAR | Status: AC
  Filled 2015-02-17: qty 30

## 2015-02-17 MED ORDER — MIDAZOLAM HCL 2 MG/2ML IJ SOLN
1.0000 mg | INTRAMUSCULAR | Status: DC | PRN
  Administered 2015-02-17: 2 mg via INTRAVENOUS

## 2015-02-17 MED ORDER — GLYCOPYRROLATE 0.2 MG/ML IJ SOLN: 0.2000 mg | Freq: Once | INTRAMUSCULAR | Status: DC | PRN

## 2015-02-17 MED ORDER — DEXAMETHASONE SODIUM PHOSPHATE 4 MG/ML IJ SOLN
INTRAMUSCULAR | Status: DC | PRN
  Administered 2015-02-17: 10 mg via INTRAVENOUS

## 2015-02-17 MED ORDER — LACTATED RINGERS IV SOLN
INTRAVENOUS | Status: DC
  Administered 2015-02-17 (×2): via INTRAVENOUS

## 2015-02-17 MED ORDER — KETOROLAC TROMETHAMINE 30 MG/ML IJ SOLN: 30.0000 mg | Freq: Once | INTRAMUSCULAR | Status: DC

## 2015-02-17 MED ORDER — PROMETHAZINE HCL 25 MG/ML IJ SOLN
6.2500 mg | INTRAMUSCULAR | Status: DC | PRN
Start: 2015-02-17 — End: 2015-02-17

## 2015-02-17 MED ORDER — MIDAZOLAM HCL 2 MG/2ML IJ SOLN
INTRAMUSCULAR | Status: AC
  Filled 2015-02-17: qty 2

## 2015-02-17 MED ORDER — LACTATED RINGERS IV SOLN
INTRAVENOUS | Status: DC
  Administered 2015-02-17: 10:00:00 via INTRAVENOUS

## 2015-02-17 MED ORDER — FENTANYL CITRATE (PF) 100 MCG/2ML IJ SOLN
50.0000 ug | INTRAMUSCULAR | Status: AC | PRN
  Administered 2015-02-17: 50 ug via INTRAVENOUS
  Administered 2015-02-17: 100 ug via INTRAVENOUS
  Administered 2015-02-17: 50 ug via INTRAVENOUS

## 2015-02-17 MED ORDER — ONDANSETRON HCL 4 MG PO TABS: 4.0000 mg | ORAL_TABLET | Freq: Three times a day (TID) | ORAL | Status: AC | PRN

## 2015-02-17 MED ORDER — CEFAZOLIN SODIUM-DEXTROSE 2-3 GM-% IV SOLR
2.0000 g | INTRAVENOUS | Status: AC
  Administered 2015-02-17: 2 g via INTRAVENOUS

## 2015-02-17 MED ORDER — SODIUM CHLORIDE 0.9 % IR SOLN
Status: DC | PRN
  Administered 2015-02-17: 3000 mL

## 2015-02-17 MED ORDER — SCOPOLAMINE 1 MG/3DAYS TD PT72: 1.0000 | MEDICATED_PATCH | Freq: Once | TRANSDERMAL | Status: DC | PRN

## 2015-02-17 MED ORDER — OXYCODONE-ACETAMINOPHEN 5-325 MG PO TABS
1.0000 | ORAL_TABLET | ORAL | Status: AC | PRN
Start: 2015-02-17 — End: ?

## 2015-02-17 MED ORDER — CHLORHEXIDINE GLUCONATE 4 % EX LIQD: 60.0000 mL | Freq: Once | CUTANEOUS | Status: DC

## 2015-02-17 MED ORDER — DEXAMETHASONE SODIUM PHOSPHATE 10 MG/ML IJ SOLN
INTRAMUSCULAR | Status: AC
  Filled 2015-02-17: qty 1

## 2015-02-17 MED ORDER — LIDOCAINE HCL (CARDIAC) 20 MG/ML IV SOLN
INTRAVENOUS | Status: AC
  Filled 2015-02-17: qty 5

## 2015-02-17 MED ORDER — FENTANYL CITRATE (PF) 100 MCG/2ML IJ SOLN: 25.0000 ug | INTRAMUSCULAR | Status: DC | PRN

## 2015-02-17 SURGICAL SUPPLY — 40 items
BANDAGE ELASTIC 6 VELCRO ST LF (GAUZE/BANDAGES/DRESSINGS) ×3 IMPLANT
BLADE CUDA 5.5 (BLADE) IMPLANT
BLADE CUDA GRT WHITE 3.5 (BLADE) IMPLANT
BLADE CUTTER GATOR 3.5 (BLADE) ×3 IMPLANT
BLADE CUTTER MENIS 5.5 (BLADE) IMPLANT
BLADE GREAT WHITE 4.2 (BLADE) ×2 IMPLANT
BLADE GREAT WHITE 4.2MM (BLADE) ×1
BUR OVAL 4.0 (BURR) IMPLANT
CUTTER MENISCUS  4.2MM (BLADE)
CUTTER MENISCUS 4.2MM (BLADE) IMPLANT
DRAPE ARTHROSCOPY W/POUCH 90 (DRAPES) ×3 IMPLANT
DURAPREP 26ML APPLICATOR (WOUND CARE) ×3 IMPLANT
ELECT MENISCUS 165MM 90D (ELECTRODE) IMPLANT
ELECT REM PT RETURN 9FT ADLT (ELECTROSURGICAL)
ELECTRODE REM PT RTRN 9FT ADLT (ELECTROSURGICAL) IMPLANT
GAUZE SPONGE 4X4 12PLY STRL (GAUZE/BANDAGES/DRESSINGS) ×3 IMPLANT
GAUZE XEROFORM 1X8 LF (GAUZE/BANDAGES/DRESSINGS) ×3 IMPLANT
GLOVE BIO SURGEON STRL SZ7 (GLOVE) ×3 IMPLANT
GLOVE BIOGEL PI IND STRL 7.0 (GLOVE) ×3 IMPLANT
GLOVE BIOGEL PI INDICATOR 7.0 (GLOVE) ×6
GLOVE ECLIPSE 6.5 STRL STRAW (GLOVE) ×3 IMPLANT
GLOVE ECLIPSE 7.0 STRL STRAW (GLOVE) ×3 IMPLANT
GLOVE SURG ORTHO 8.0 STRL STRW (GLOVE) ×3 IMPLANT
GOWN STRL REUS W/ TWL LRG LVL3 (GOWN DISPOSABLE) ×2 IMPLANT
GOWN STRL REUS W/ TWL XL LVL3 (GOWN DISPOSABLE) ×1 IMPLANT
GOWN STRL REUS W/TWL LRG LVL3 (GOWN DISPOSABLE) ×4
GOWN STRL REUS W/TWL XL LVL3 (GOWN DISPOSABLE) ×2
HOLDER KNEE FOAM BLUE (MISCELLANEOUS) ×3 IMPLANT
IV NS IRRIG 3000ML ARTHROMATIC (IV SOLUTION) ×6 IMPLANT
KNEE WRAP E Z 3 GEL PACK (MISCELLANEOUS) ×3 IMPLANT
MANIFOLD NEPTUNE II (INSTRUMENTS) ×3 IMPLANT
PACK ARTHROSCOPY DSU (CUSTOM PROCEDURE TRAY) ×3 IMPLANT
PACK BASIN DAY SURGERY FS (CUSTOM PROCEDURE TRAY) ×3 IMPLANT
PENCIL BUTTON HOLSTER BLD 10FT (ELECTRODE) IMPLANT
SET ARTHROSCOPY TUBING (MISCELLANEOUS) ×2
SET ARTHROSCOPY TUBING LN (MISCELLANEOUS) ×1 IMPLANT
SUT ETHILON 3 0 PS 1 (SUTURE) ×3 IMPLANT
SUT VIC AB 3-0 FS2 27 (SUTURE) IMPLANT
TOWEL OR 17X24 6PK STRL BLUE (TOWEL DISPOSABLE) ×3 IMPLANT
WATER STERILE IRR 1000ML POUR (IV SOLUTION) ×3 IMPLANT

## 2015-02-17 NOTE — Anesthesia Preprocedure Evaluation (Signed)
Anesthesia Evaluation  Patient identified by MRN, date of birth, ID band Patient awake    Reviewed: Allergy & Precautions, NPO status , Patient's Chart, lab work & pertinent test results  Airway Mallampati: II  TM Distance: <3 FB Neck ROM: Full    Dental no notable dental hx.    Pulmonary neg pulmonary ROS,    Pulmonary exam normal breath sounds clear to auscultation       Cardiovascular hypertension, Pt. on medications Normal cardiovascular exam Rhythm:Regular Rate:Normal     Neuro/Psych negative neurological ROS  negative psych ROS   GI/Hepatic negative GI ROS, Neg liver ROS,   Endo/Other  diabetesMorbid obesity  Renal/GU negative Renal ROS  negative genitourinary   Musculoskeletal negative musculoskeletal ROS (+)   Abdominal   Peds negative pediatric ROS (+)  Hematology negative hematology ROS (+)   Anesthesia Other Findings   Reproductive/Obstetrics negative OB ROS                             Anesthesia Physical Anesthesia Plan  ASA: III  Anesthesia Plan: General   Post-op Pain Management:    Induction: Intravenous  Airway Management Planned: LMA  Additional Equipment:   Intra-op Plan:   Post-operative Plan: Extubation in OR  Informed Consent: I have reviewed the patients History and Physical, chart, labs and discussed the procedure including the risks, benefits and alternatives for the proposed anesthesia with the patient or authorized representative who has indicated his/her understanding and acceptance.   Dental advisory given  Plan Discussed with: CRNA and Surgeon  Anesthesia Plan Comments:         Anesthesia Quick Evaluation

## 2015-02-17 NOTE — Anesthesia Procedure Notes (Signed)
Procedure Name: LMA Insertion Date/Time: 02/17/2015 10:56 AM Performed by: Zenia ResidesPAYNE, Macel Yearsley D Pre-anesthesia Checklist: Patient identified, Emergency Drugs available, Suction available and Patient being monitored Patient Re-evaluated:Patient Re-evaluated prior to inductionOxygen Delivery Method: Circle System Utilized Preoxygenation: Pre-oxygenation with 100% oxygen Intubation Type: IV induction Ventilation: Mask ventilation without difficulty LMA: LMA inserted LMA Size: 5.0 Number of attempts: 1 Airway Equipment and Method: bite block Placement Confirmation: positive ETCO2 Tube secured with: Tape Dental Injury: Teeth and Oropharynx as per pre-operative assessment

## 2015-02-17 NOTE — Transfer of Care (Signed)
Immediate Anesthesia Transfer of Care Note  Patient: Lee [name]  Procedure(s) Performed: Procedure(s): LEFT KNEE ARTHROSCOPY WITH MENISCECTOMY MEDIAL and LATERAL, TRICOMPARTMENTAL CHRONDROPLASTY (Left)  Patient Location: PACU  Anesthesia Type:General  Level of Consciousness: awake, sedated and patient cooperative  Airway & Oxygen Therapy: Patient Spontanous Breathing and Patient connected to face mask oxygen  Post-op Assessment: Report given to RN and Post -op Vital signs reviewed and stable  Post vital signs: Reviewed and stable  Last Vitals:  Filed Vitals:   02/17/15 0942  BP: 138/78  Pulse: 78  Temp: 36.8 C  Resp: 20    Complications: No apparent anesthesia complications

## 2015-02-17 NOTE — Interval H&P Note (Signed)
History and Physical Interval Note:  02/17/2015 7:34 AM  Lee [name]  has presented today for surgery, with the diagnosis of OTHER TEAR OF MEDIAL MENISCUS CURRENT INJURY LEFT KNEE   The various methods of treatment have been discussed with the patient and family. After consideration of risks, benefits and other options for treatment, the patient has consented to  Procedure(s): LEFT KNEE ARTHROSCOPY WITH MENISCECTOMY MEDIAL (Left) as a surgical intervention .  The patient's history has been reviewed, patient examined, no change in status, stable for surgery.  I have reviewed the patient's chart and labs.  Questions were answered to the patient's satisfaction.     Loreta Aveaniel F Tamira Ryland

## 2015-02-17 NOTE — Anesthesia Postprocedure Evaluation (Signed)
  Anesthesia Post-op Note  Patient: Cyril MourningDanny Maynez  Procedure(s) Performed: Procedure(s) (LRB): LEFT KNEE ARTHROSCOPY WITH MENISCECTOMY MEDIAL and LATERAL, TRICOMPARTMENTAL CHRONDROPLASTY (Left)  Patient Location: PACU  Anesthesia Type: General  Level of Consciousness: awake and alert   Airway and Oxygen Therapy: Patient Spontanous Breathing  Post-op Pain: mild  Post-op Assessment: Post-op Vital signs reviewed, Patient's Cardiovascular Status Stable, Respiratory Function Stable, Patent Airway and No signs of Nausea or vomiting  Last Vitals:  Filed Vitals:   02/17/15 1215  BP: 111/80  Pulse: 71  Temp:   Resp: 13    Post-op Vital Signs: stable   Complications: No apparent anesthesia complications

## 2015-02-17 NOTE — Discharge Instructions (Signed)
Keep dressings clean and dry for 3 days then ok to remove and shower.  No soaking or tub baths.   Weight bearing as tolerated in the leg    Post Anesthesia Home Care Instructions  Activity: Get plenty of rest for the remainder of the day. A responsible adult should stay with you for 24 hours following the procedure.  For the next 24 hours, DO NOT: -Drive a car -Advertising copywriterperate machinery -Drink alcoholic beverages -Take any medication unless instructed by your physician -Make any legal decisions or sign important papers.  Meals: Start with liquid foods such as gelatin or soup. Progress to regular foods as tolerated. Avoid greasy, spicy, heavy foods. If nausea and/or vomiting occur, drink only clear liquids until the nausea and/or vomiting subsides. Call your physician if vomiting continues.  Special Instructions/Symptoms: Your throat may feel dry or sore from the anesthesia or the breathing tube placed in your throat during surgery. If this causes discomfort, gargle with warm salt water. The discomfort should disappear within 24 hours.  If you had a scopolamine patch placed behind your ear for the management of post- operative nausea and/or vomiting:  1. The medication in the patch is effective for 72 hours, after which it should be removed.  Wrap patch in a tissue and discard in the trash. Wash hands thoroughly with soap and water. 2. You may remove the patch earlier than 72 hours if you experience unpleasant side effects which may include dry mouth, dizziness or visual disturbances. 3. Avoid touching the patch. Wash your hands with soap and water after contact with the patch.

## 2015-02-18 ENCOUNTER — Encounter (HOSPITAL_BASED_OUTPATIENT_CLINIC_OR_DEPARTMENT_OTHER): Payer: Self-pay | Admitting: Orthopedic Surgery

## 2015-02-18 NOTE — Op Note (Signed)
NAME:  Lee [name], Lee [name]                       ACCOUNT NO.:  MEDICAL RECORD NO.:  [id number]  LOCATION:                                 FACILITY:  PHYSICIAN:  Loreta Aveaniel F. Murphy, M.D.      DATE OF BIRTH:  DATE OF PROCEDURE:  02/17/2015 DATE OF DISCHARGE:                              OPERATIVE REPORT   PREOPERATIVE DIAGNOSIS:  Left knee medial meniscus tear.  POSTOPERATIVE DIAGNOSIS:  Left knee medial meniscus tear with complex tear medial meniscus.  Small radial tear, posterior third lateral meniscus.  Grade 2 and 3 changes diffusely on the patella, medial femoral condyle.  Grade 2, lateral tibial plateau.  PROCEDURE:  Left knee exam under anesthesia and arthroscopy.  Partial medial and lateral meniscectomy.  Tricompartmental chondroplasty.  SURGEON:  Loreta Aveaniel F. Murphy, M.D.  ASSISTANT:  Martina SinnerBrittney Keller, PA.  ANESTHESIA:  General.  BLOOD LOSS:  Minimal.  SPECIMENS:  None.  CULTURES:  None.  COMPLICATIONS:  None.  DRESSINGS:  Soft compressive.  TOURNIQUET:  Not employed.  PROCEDURE IN DETAIL:  The patient was brought to the operating room, placed on the operating table in supine position.  After adequate anesthesia had been obtained, leg holder applied.  Leg was prepped and draped in usual sterile fashion.  Two portals, medial and lateral parapatellar.  Arthroscope was introduced.  Knee distended and inspected.  Good patellar tracking with some grade 2 and 3 changes peak of patella with chondral debris.  Chondroplasty to a stable surface. Trochlea looked good.  ACL intact.  Medial compartment grade 2 mild grade 3 changes, weightbearing dome debrided.  Extensive complex tearing posterior medial meniscus taken out to a stable rim, tapered into remaining meniscus.  Laterally some grade 2 changes on the plateau debrided.  Radial tear junction of middle posterior third saucerized out and tapered smoothly on either side.  Entire knee examined.  No other findings were  appreciated.  Instruments were removed.  Portals were closed with nylon.  Knee injected with Depo-Medrol and Marcaine. Sterile compressive dressing applied.  Anesthesia reversed.  Brought to the recovery room.  Tolerated the surgery well.  No complications.     Loreta Aveaniel F. Murphy, M.D.     DFM/MEDQ  D:  02/17/2015  T:  02/18/2015  Job:  [id number]

## 2015-02-23 ENCOUNTER — Encounter (HOSPITAL_BASED_OUTPATIENT_CLINIC_OR_DEPARTMENT_OTHER): Payer: Self-pay | Admitting: Orthopedic Surgery

## 2015-12-21 IMAGING — CR DG KNEE 1-2V PORT*R*
2 series · 2 of 2 positions shown · non-contrast
Comparison: None.

CLINICAL DATA: Postoperative radiographs following RIGHT total knee
arthroplasty.

EXAM:
PORTABLE RIGHT KNEE - 1-2 VIEW

[AP]
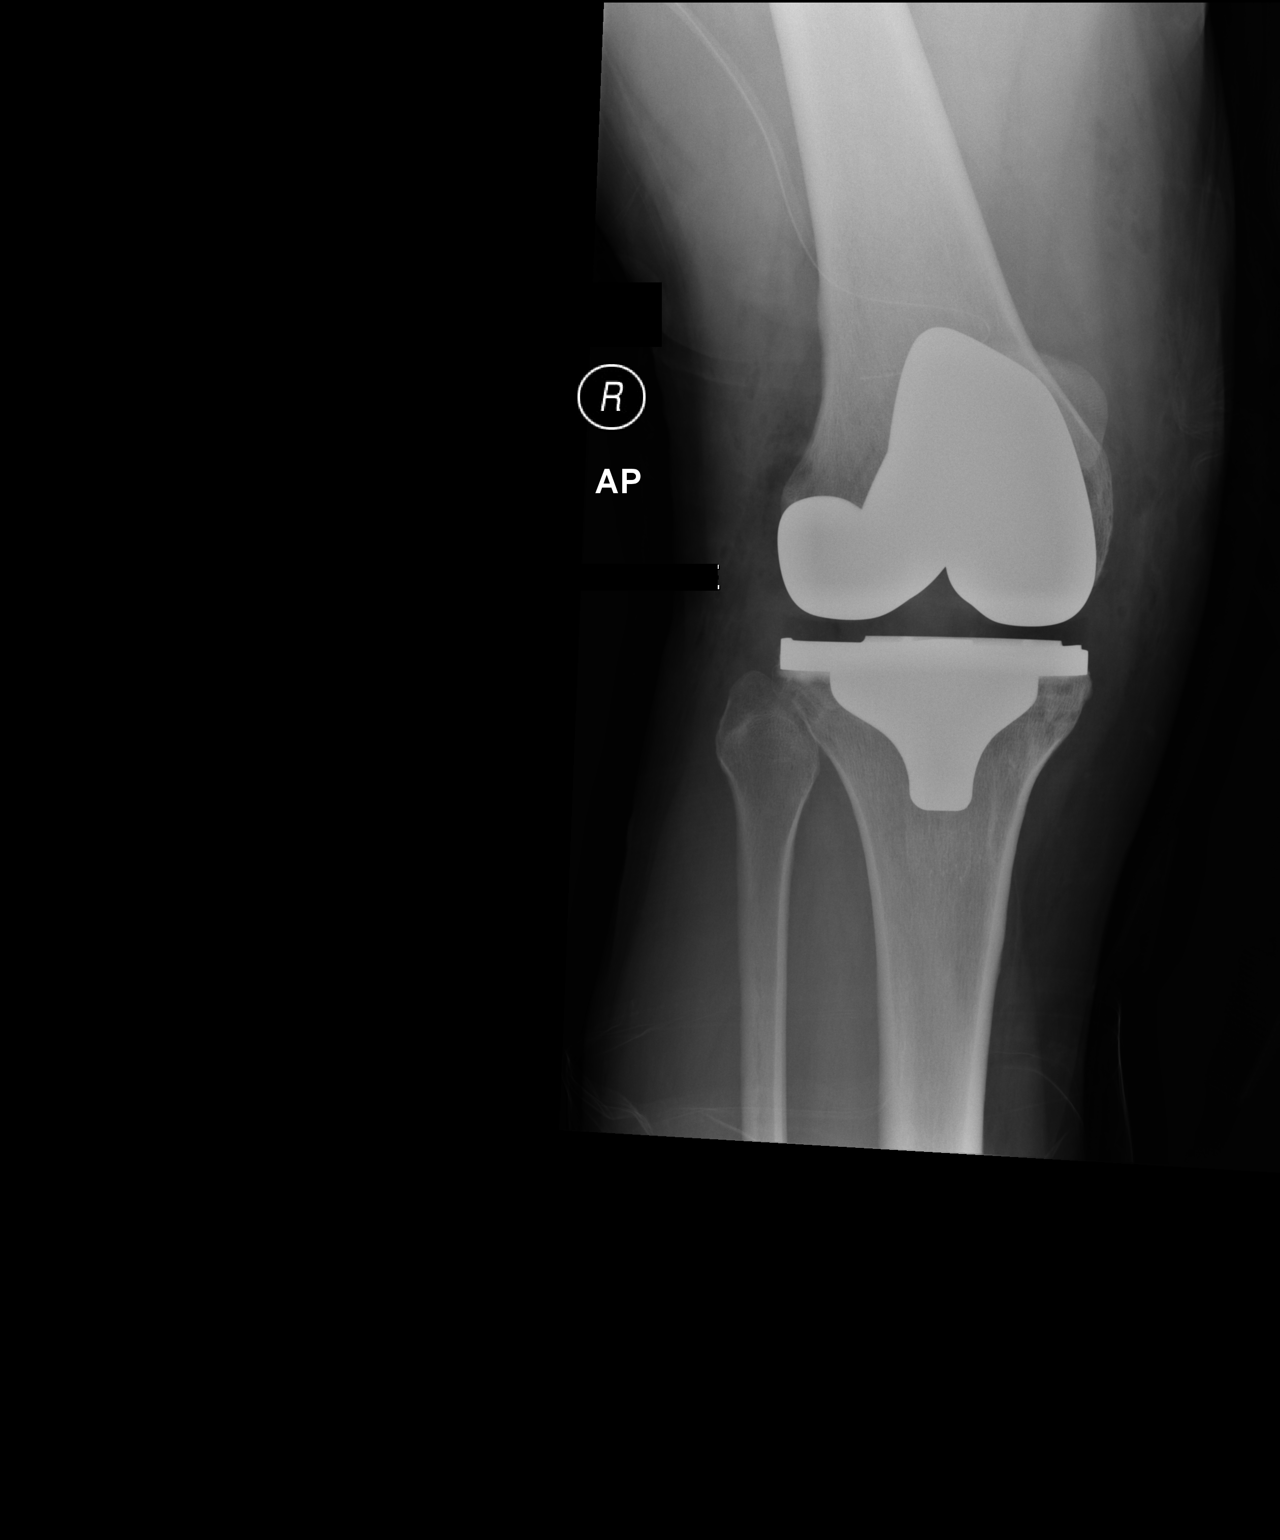

[xtable lateral]
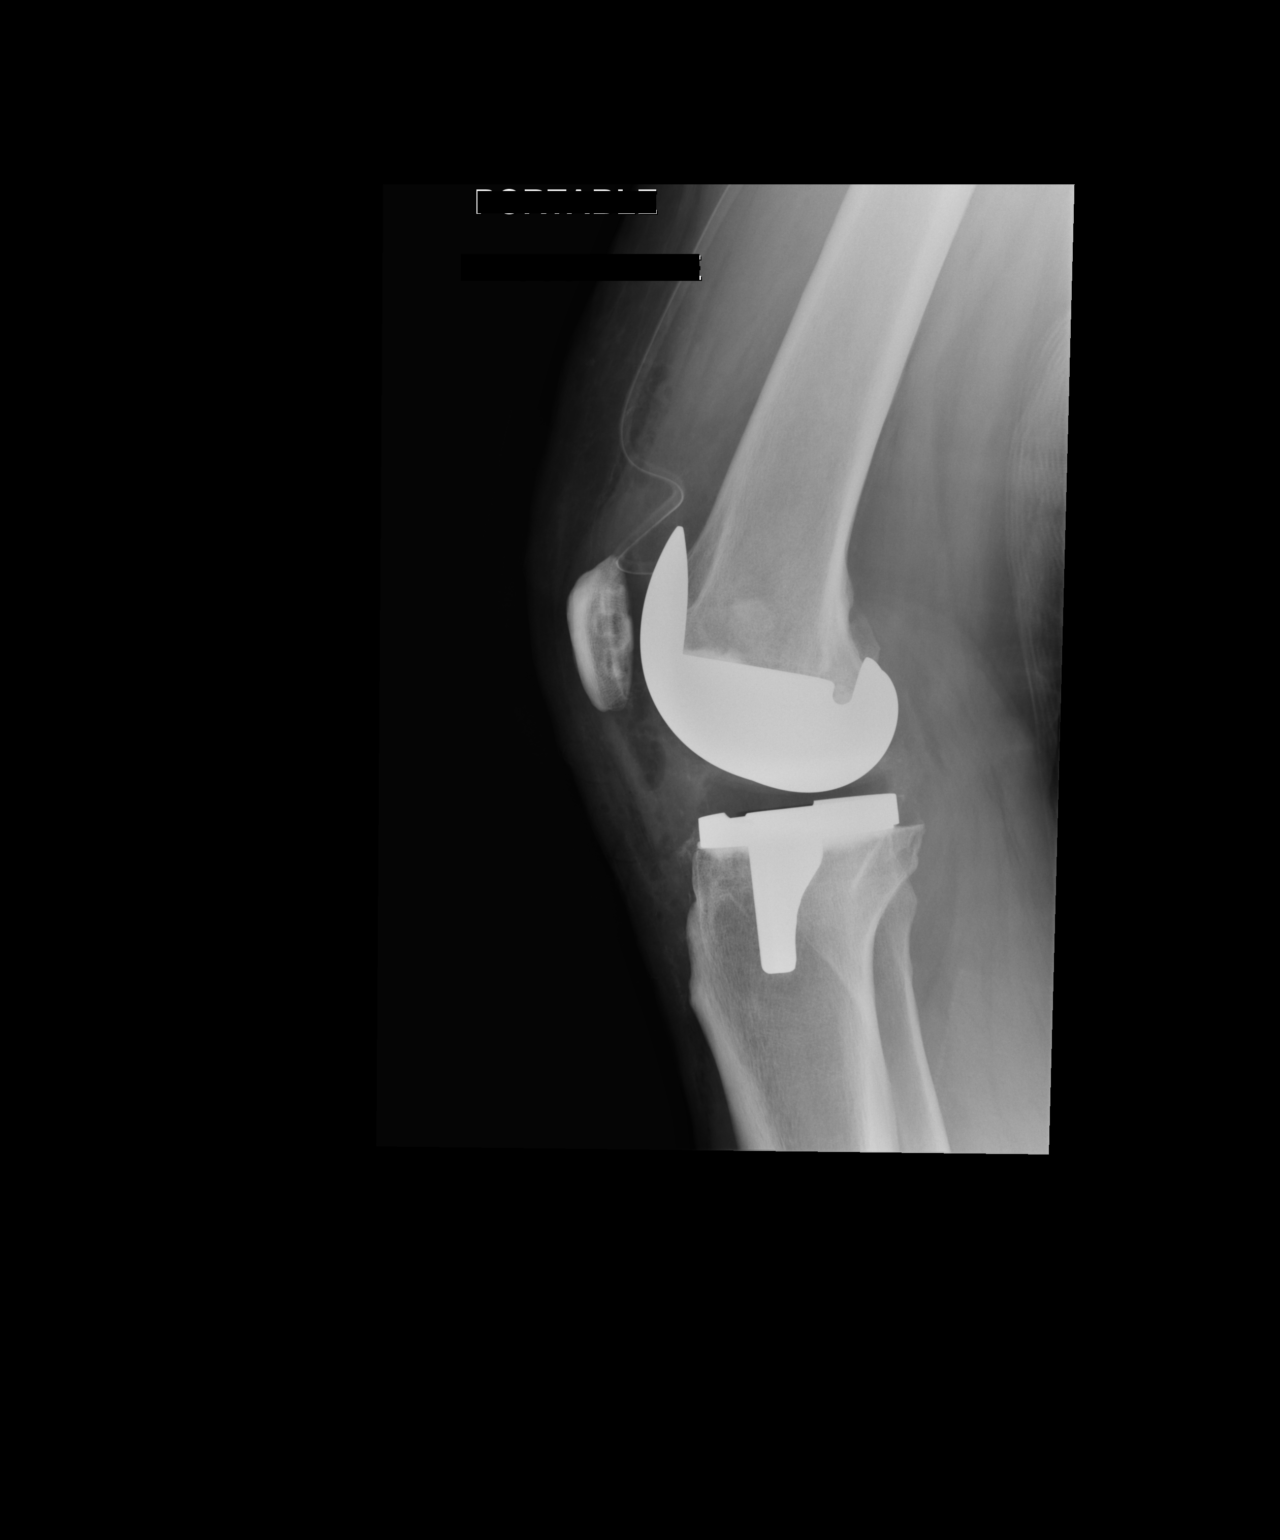

[2 of 2 positions shown; findings below may reference images not displayed]

FINDINGS: Uncomplicated RIGHT 3 part total knee arthroplasty. Expected
postsurgical changes in the soft tissues with surgical drain
present.
IMPRESSION: Uncomplicated new RIGHT 3 part total knee arthroplasty.
